# Patient Record
Sex: Female | Born: 1960
Health system: Southern US, Community
[De-identification: ages and names within clinical notes are randomized; demographics above are authoritative.]

## PROBLEM LIST (undated history)

## (undated) DIAGNOSIS — T7840XA Allergy, unspecified, initial encounter: Secondary | ICD-10-CM

## (undated) DIAGNOSIS — D249 Benign neoplasm of unspecified breast: Secondary | ICD-10-CM

## (undated) DIAGNOSIS — K219 Gastro-esophageal reflux disease without esophagitis: Secondary | ICD-10-CM

## (undated) DIAGNOSIS — N390 Urinary tract infection, site not specified: Secondary | ICD-10-CM

## (undated) HISTORY — DX: Allergy, unspecified, initial encounter: T78.40XA

## (undated) HISTORY — PX: FRACTURE SURGERY: SHX138

## (undated) HISTORY — DX: Benign neoplasm of unspecified breast: D24.9

---

## 1998-08-01 ENCOUNTER — Other Ambulatory Visit: Admission: RE | Admit: 1998-08-01 | Discharge: 1998-08-01 | Payer: Self-pay | Admitting: Podiatry

## 2002-08-17 ENCOUNTER — Other Ambulatory Visit: Admission: RE | Admit: 2002-08-17 | Discharge: 2002-08-17 | Payer: Self-pay | Admitting: Obstetrics and Gynecology

## 2003-10-24 ENCOUNTER — Other Ambulatory Visit: Admission: RE | Admit: 2003-10-24 | Discharge: 2003-10-24 | Payer: Self-pay | Admitting: Obstetrics and Gynecology

## 2004-03-14 ENCOUNTER — Encounter: Admission: RE | Admit: 2004-03-14 | Discharge: 2004-03-14 | Payer: Self-pay | Admitting: Obstetrics and Gynecology

## 2004-05-07 ENCOUNTER — Encounter: Admission: RE | Admit: 2004-05-07 | Discharge: 2004-05-07 | Payer: Self-pay | Admitting: Interventional Radiology

## 2004-06-04 ENCOUNTER — Observation Stay (HOSPITAL_COMMUNITY): Admission: RE | Admit: 2004-06-04 | Discharge: 2004-06-05 | Payer: Self-pay | Admitting: Interventional Radiology

## 2004-07-31 ENCOUNTER — Ambulatory Visit (HOSPITAL_COMMUNITY): Admission: RE | Admit: 2004-07-31 | Discharge: 2004-07-31 | Payer: Self-pay | Admitting: Obstetrics and Gynecology

## 2004-12-03 ENCOUNTER — Encounter: Admission: RE | Admit: 2004-12-03 | Discharge: 2004-12-03 | Payer: Self-pay | Admitting: Obstetrics and Gynecology

## 2004-12-24 ENCOUNTER — Encounter: Admission: RE | Admit: 2004-12-24 | Discharge: 2004-12-24 | Payer: Self-pay | Admitting: Interventional Radiology

## 2005-05-07 ENCOUNTER — Emergency Department (HOSPITAL_COMMUNITY): Admission: EM | Admit: 2005-05-07 | Discharge: 2005-05-07 | Payer: Self-pay | Admitting: Emergency Medicine

## 2005-11-27 ENCOUNTER — Ambulatory Visit (HOSPITAL_COMMUNITY): Admission: RE | Admit: 2005-11-27 | Discharge: 2005-11-27 | Payer: Self-pay | Admitting: Obstetrics and Gynecology

## 2006-07-28 HISTORY — PX: ENDOMETRIAL ABLATION: SHX621

## 2007-07-06 ENCOUNTER — Ambulatory Visit (HOSPITAL_COMMUNITY): Admission: RE | Admit: 2007-07-06 | Discharge: 2007-07-06 | Payer: Self-pay | Admitting: Obstetrics and Gynecology

## 2008-11-20 ENCOUNTER — Ambulatory Visit (HOSPITAL_COMMUNITY): Admission: RE | Admit: 2008-11-20 | Discharge: 2008-11-20 | Payer: Self-pay | Admitting: Obstetrics and Gynecology

## 2009-11-22 ENCOUNTER — Ambulatory Visit (HOSPITAL_COMMUNITY): Admission: RE | Admit: 2009-11-22 | Discharge: 2009-11-22 | Payer: Self-pay | Admitting: Obstetrics and Gynecology

## 2010-08-18 ENCOUNTER — Encounter: Payer: Self-pay | Admitting: Obstetrics and Gynecology

## 2010-10-18 ENCOUNTER — Other Ambulatory Visit (HOSPITAL_COMMUNITY): Payer: Self-pay | Admitting: Obstetrics and Gynecology

## 2010-10-18 DIAGNOSIS — Z1231 Encounter for screening mammogram for malignant neoplasm of breast: Secondary | ICD-10-CM

## 2010-11-26 ENCOUNTER — Ambulatory Visit (HOSPITAL_COMMUNITY)
Admission: RE | Admit: 2010-11-26 | Discharge: 2010-11-26 | Disposition: A | Discharge status: Home / Self Care | Payer: BC Managed Care – PPO | Source: Ambulatory Visit | Attending: Obstetrics and Gynecology | Admitting: Obstetrics and Gynecology

## 2010-11-26 DIAGNOSIS — Z1231 Encounter for screening mammogram for malignant neoplasm of breast: Secondary | ICD-10-CM | POA: Insufficient documentation

## 2011-09-30 ENCOUNTER — Other Ambulatory Visit: Payer: Self-pay | Admitting: Obstetrics and Gynecology

## 2011-09-30 DIAGNOSIS — N6321 Unspecified lump in the left breast, upper outer quadrant: Secondary | ICD-10-CM

## 2011-10-07 ENCOUNTER — Ambulatory Visit
Admission: RE | Admit: 2011-10-07 | Discharge: 2011-10-07 | Disposition: A | Discharge status: Home / Self Care | Payer: BC Managed Care – PPO | Source: Ambulatory Visit | Attending: Obstetrics and Gynecology | Admitting: Obstetrics and Gynecology

## 2011-10-07 DIAGNOSIS — N6321 Unspecified lump in the left breast, upper outer quadrant: Secondary | ICD-10-CM

## 2012-05-11 ENCOUNTER — Other Ambulatory Visit: Payer: Self-pay | Admitting: Obstetrics and Gynecology

## 2012-05-11 DIAGNOSIS — Z1231 Encounter for screening mammogram for malignant neoplasm of breast: Secondary | ICD-10-CM

## 2012-06-11 ENCOUNTER — Ambulatory Visit
Admission: RE | Admit: 2012-06-11 | Discharge: 2012-06-11 | Disposition: A | Discharge status: Home / Self Care | Payer: BC Managed Care – PPO | Source: Ambulatory Visit | Attending: Obstetrics and Gynecology | Admitting: Obstetrics and Gynecology

## 2012-06-11 DIAGNOSIS — Z1231 Encounter for screening mammogram for malignant neoplasm of breast: Secondary | ICD-10-CM

## 2012-06-15 ENCOUNTER — Other Ambulatory Visit: Payer: Self-pay | Admitting: Obstetrics and Gynecology

## 2012-06-15 DIAGNOSIS — R928 Other abnormal and inconclusive findings on diagnostic imaging of breast: Secondary | ICD-10-CM

## 2012-06-22 ENCOUNTER — Other Ambulatory Visit: Payer: Self-pay | Admitting: Obstetrics and Gynecology

## 2012-06-22 ENCOUNTER — Ambulatory Visit
Admission: RE | Admit: 2012-06-22 | Discharge: 2012-06-22 | Disposition: A | Discharge status: Home / Self Care | Payer: BC Managed Care – PPO | Source: Ambulatory Visit | Attending: Obstetrics and Gynecology | Admitting: Obstetrics and Gynecology

## 2012-06-22 DIAGNOSIS — R928 Other abnormal and inconclusive findings on diagnostic imaging of breast: Secondary | ICD-10-CM

## 2012-07-06 ENCOUNTER — Ambulatory Visit
Admission: RE | Admit: 2012-07-06 | Discharge: 2012-07-06 | Disposition: A | Discharge status: Home / Self Care | Payer: BC Managed Care – PPO | Source: Ambulatory Visit | Attending: Obstetrics and Gynecology | Admitting: Obstetrics and Gynecology

## 2012-07-06 ENCOUNTER — Other Ambulatory Visit: Payer: Self-pay | Admitting: Diagnostic Radiology

## 2012-07-06 DIAGNOSIS — R928 Other abnormal and inconclusive findings on diagnostic imaging of breast: Secondary | ICD-10-CM

## 2012-07-16 ENCOUNTER — Ambulatory Visit (INDEPENDENT_AMBULATORY_CARE_PROVIDER_SITE_OTHER): Payer: BC Managed Care – PPO | Admitting: Surgery

## 2012-07-16 ENCOUNTER — Encounter (INDEPENDENT_AMBULATORY_CARE_PROVIDER_SITE_OTHER): Payer: Self-pay | Admitting: Surgery

## 2012-07-16 VITALS — BP 132/78 | HR 76 | Temp 97.8°F | Resp 16 | Ht 67.0 in | Wt 155.1 lb

## 2012-07-16 DIAGNOSIS — D249 Benign neoplasm of unspecified breast: Secondary | ICD-10-CM

## 2012-07-16 NOTE — Progress Notes (Signed)
Patient ID: Sheryl Bryan, female   DOB: March 08, 1961, 51 y.o.   MRN: [id number]  Chief Complaint  Patient presents with  . New Evaluation    Breast papilloma    HPI Sheryl Bryan is a 51 y.o. female.  Pt sent at request of Dr Si Gaul for right breast calcifications and core biopsy showing papilloma. The calcifications were indeterminate.  No history of breast mass nipple discharge or pain. HPI  Past Medical History  Diagnosis Date  . Papilloma of breast     Past Surgical History  Procedure Date  . Cesarean section 12/04/93, 01/05/79  . Endometrial ablation 2008    Family History  Problem Relation Age of Onset  . Cancer Mother     multiple myeloma  . Cancer Brother     leukemia  . Cancer Maternal Aunt     breast  . Cancer Maternal Aunt   . COPD Maternal Aunt     breast - great aunt    Social History History  Substance Use Topics  . Smoking status: Never Smoker   . Smokeless tobacco: Never Used  . Alcohol Use: No    Allergies  Allergen Reactions  . Sulfa Antibiotics Hives    Spreads all over the body    Current Outpatient Prescriptions  Medication Sig Dispense Refill  . norethindrone (JOLIVETTE) 0.35 MG tablet Take 1 tablet by mouth daily.      . valACYclovir (VALTREX) 500 MG tablet Take 500 mg by mouth 2 (two) times daily.        Review of Systems Review of Systems  Constitutional: Negative for fever, chills and unexpected weight change.  HENT: Negative for hearing loss, congestion, sore throat, trouble swallowing and voice change.   Eyes: Negative for visual disturbance.  Respiratory: Negative for cough and wheezing.   Cardiovascular: Negative for chest pain, palpitations and leg swelling.  Gastrointestinal: Negative for nausea, vomiting, abdominal pain, diarrhea, constipation, blood in stool, abdominal distention and anal bleeding.  Genitourinary: Negative for hematuria, vaginal bleeding and difficulty urinating.  Musculoskeletal: Negative for  arthralgias.  Skin: Negative for rash and wound.  Neurological: Negative for seizures, syncope and headaches.  Hematological: Negative for adenopathy. Does not bruise/bleed easily.  Psychiatric/Behavioral: Negative for confusion.    Blood pressure 132/78, pulse 76, temperature 97.8 F (36.6 C), temperature source Temporal, resp. rate 16, height 5\' 7"  (1.702 m), weight 155 lb 2 oz (70.364 kg).  Physical Exam Physical Exam  Constitutional: She is oriented to person, place, and time. She appears well-developed and well-nourished.  HENT:  Head: Normocephalic and atraumatic.  Eyes: EOM are normal. Pupils are equal, round, and reactive to light.  Neck: Normal range of motion. Neck supple.  Cardiovascular: Normal rate and regular rhythm.   Pulmonary/Chest: Effort normal and breath sounds normal. Right breast exhibits no inverted nipple, no mass, no nipple discharge, no skin change and no tenderness. Left breast exhibits no inverted nipple, no mass, no nipple discharge, no skin change and no tenderness. Breasts are symmetrical.  Musculoskeletal: Normal range of motion.  Neurological: She is alert and oriented to person, place, and time.  Skin: Skin is warm and dry.  Psychiatric: She has a normal mood and affect. Her behavior is normal. Judgment and thought content normal.    Data Reviewed Clinical Data: 51 year old female with indeterminate  calcifications in the inner subareolar right breast - for tissue  sampling.  STEREOTACTIC-GUIDED VACUUM ASSISTED BIOPSY OF THE RIGHT BREAST AND  SPECIMEN RADIOGRAPH  Comparison: Previous  exams.  I met with the patient and we discussed the procedure of  stereotactic-guided biopsy, including benefits and alternatives.  We discussed the high likelihood of a successful procedure. We  discussed the risks of the procedure, including infection,  bleeding, tissue injury, clip migration, and inadequate sampling.  Informed, written consent was given.  Using  sterile technique, 2% lidocaine, stereotactic guidance, and a  9 gauge vacuum assisted device, biopsy was performed of the cluster  of calcifications within the inner subareolar right breast using a  inferior approach. Specimen radiograph was performed, showing  calcifications. Specimens with calcifications are identified for  pathology.  At the conclusion of the procedure, a hourglass shaped tissue  marker clip was deployed into the biopsy cavity. Follow-up 2-view  mammogram confirmed clip placement to be satisfactory.  IMPRESSION:  Stereotactic-guided biopsy of right breast calcifications. No  apparent complications.  Final pathology demonstrates PAPILLOMA WITH CALCIFICATIONS - NO  ATYPIA OR MALIGNANCY.  Histology correlates with imaging findings.  The patient was contacted by phone on 07/08/2012 and these results  given to her which she understood. Her questions were answered.  The patient had no complaints with her biopsy site.  Recommend surgical consultation of the right breast. A surgical  consultation appointment Dr. Luisa Hart [facility]) has  been scheduled for 07/16/2012 and the patient informed.  Original Report Authenticated By: Harmon Pier, M.D.   Assessment    Right breast microcalcifications and papilloma on core biopsy    Plan    Recommend right breast needle localized partial mastectomy. Discussed observation as well. Small but finite risk of malignancy in this setting noted. She would like time to think about her choices. I gave her information about lumpectomy. Risks, benefits of each discussed at great length. She will call me once she makes her mind up about next step.       Nicey Krah A. 07/16/2012, 11:11 AM

## 2012-07-16 NOTE — Patient Instructions (Signed)
Lumpectomy, Breast Conserving Surgery Care After Please read the instructions outlined below and refer to this sheet in the next few weeks. These discharge instructions provide you with general information on caring for yourself after you leave the hospital. Your surgeon may also give you specific instructions. While your treatment has been planned according to the most current medical practices available, unavoidable complications occasionally occur. If you have any problems or questions after discharge, please call your surgeon. Reasons for a lumpectomy:  Any solid breast mass.  Grouped significant nodularity that may be confused with a solitary breast mass. AFTER THE PROCEDURE  After surgery, you will be taken to the recovery area where a nurse will watch and check your progress. Once you're awake, stable, and taking fluids well, barring other problems you will be allowed to go home.  Ice packs applied to your operative site may help with discomfort and keep the swelling down.  A small rubber drain may be placed in the incision for a couple of days to prevent a hematoma in the breast.  A pressure dressing may be applied for 24 to 48 hours to prevent bleeding.  Keep the wound dry.  You may resume a normal diet and activities as directed. Avoid strenuous activities affecting the arm on the side of the biopsy site such as tennis, swimming, heavy lifting (more than 10 pounds) or pulling.  Bruising in the breast is normal following this procedure.  Wearing a bra - even to bed - may be more comfortable and also help keep the dressing on.  Change dressings as directed.  Only take over-the-counter or prescription medicines for pain, discomfort, or fever as directed by your caregiver. Call for your results as instructed by your surgeon. Remember it isyour responsibility to get the results of your lumpectomy if your surgeon asked you to follow-up. Do not assume everything is fine if you have  not heard from your caregiver. SEEK MEDICAL CARE IF:   There is increased bleeding (more than a small spot) from the wound.  You notice redness, swelling, or increasing pain in the wound.  Pus is coming from wound.  An unexplained oral temperature above 102 F (38.9 C) develops.  You notice a foul smell coming from the wound or dressing. SEEK IMMEDIATE MEDICAL CARE IF:   You develop a rash.  You have difficulty breathing.  You have any allergic problems. Document Released: 07/30/2006 Document Revised: 10/06/2011 Document Reviewed: 07/02/2007 [facility] Patient Information 2013 [place], [place]. Lumpectomy, Breast Conserving Surgery A lumpectomy is breast surgery that removes only part of the breast. Another name used may be partial mastectomy. The amount removed varies. Make sure you understand how much of your breast will be removed. Reasons for a lumpectomy:  Any solid breast mass.  Grouped significant nodularity that may be confused with a solitary breast mass. Lumpectomy is the most common form of breast cancer surgery today. The surgeon removes the portion of your breast which contains the tumor (cancer). This is the lump. Some normal tissue around the lump is also removed to be sure that all the tumor has been removed.  If cancer cells are found in the margins where the breast tissue was removed, your surgeon will do more surgery to remove the remaining cancer tissue. This is called re-excision surgery. Radiation and/or chemotherapy treatments are often given following a lumpectomy to kill any cancer cells that could possibly remain.  REASONS YOU MAY NOT BE ABLE TO HAVE BREAST CONSERVING SURGERY:  The tumor  is located in more than one place.  Your breast is small and the tumor is large so the breast would be disfigured.  The entire tumor removal is not successful with a lumpectomy.  You cannot commit to a full course of chemotherapy, radiation therapy or are pregnant and  cannot have radiation.  You have previously had radiation to the breast to treat cancer. HOW A LUMPECTOMY IS PERFORMED If overnight nursing is not required following a biopsy, a lumpectomy can be performed as a same-day surgery. This can be done in a hospital, clinic, or surgical center. The anesthesia used will depend on your surgeon. They will discuss this with you. A general anesthetic keeps you sleeping through the procedure. LET YOUR CAREGIVERS KNOW ABOUT THE FOLLOWING:  Allergies  Medications taken including herbs, eye drops, over the counter medications, and creams.  Use of steroids (by mouth or creams)  Previous problems with anesthetics or Novocaine.  Possibility of pregnancy, if this applies  History of blood clots (thrombophlebitis)  History of bleeding or blood problems.  Previous surgery  Other health problems BEFORE THE PROCEDURE You should be present one hour prior to your procedure unless directed otherwise.  AFTER THE PROCEDURE  After surgery, you will be taken to the recovery area where a nurse will watch and check your progress. Once you're awake, stable, and taking fluids well, barring other problems you will be allowed to go home.  Ice packs applied to your operative site may help with discomfort and keep the swelling down.  A small rubber drain may be placed in the breast for a couple of days to prevent a hematoma from developing in the breast.  A pressure dressing may be applied for 24 to 48 hours to prevent bleeding.  Keep the wound dry.  You may resume a normal diet and activities as directed. Avoid strenuous activities affecting the arm on the side of the biopsy site such as tennis, swimming, heavy lifting (more than 10 pounds) or pulling.  Bruising in the breast is normal following this procedure.  Wearing a bra - even to bed - may be more comfortable and also help keep the dressing on.  Change dressings as directed.  Only take over-the-counter  or prescription medicines for pain, discomfort, or fever as directed by your caregiver. Call for your results as instructed by your surgeon. Remember it is your responsibility to get the results of your lumpectomy if your surgeon asked you to follow-up. Do not assume everything is fine if you have not heard from your caregiver. SEEK MEDICAL CARE IF:   There is increased bleeding (more than a small spot) from the wound.  You notice redness, swelling, or increasing pain in the wound.  Pus is coming from wound.  An unexplained oral temperature above 102 F (38.9 C) develops.  You notice a foul smell coming from the wound or dressing. SEEK IMMEDIATE MEDICAL CARE IF:   You develop a rash.  You have difficulty breathing.  You have any allergic problems. Document Released: 08/25/2006 Document Revised: 10/06/2011 Document Reviewed: 11/26/2006 [facility] Patient Information 2013 [place], [place].

## 2012-08-04 ENCOUNTER — Other Ambulatory Visit (INDEPENDENT_AMBULATORY_CARE_PROVIDER_SITE_OTHER): Payer: Self-pay | Admitting: Surgery

## 2012-08-04 DIAGNOSIS — D249 Benign neoplasm of unspecified breast: Secondary | ICD-10-CM

## 2012-08-06 ENCOUNTER — Other Ambulatory Visit (INDEPENDENT_AMBULATORY_CARE_PROVIDER_SITE_OTHER): Payer: Self-pay | Admitting: Surgery

## 2012-08-06 DIAGNOSIS — D249 Benign neoplasm of unspecified breast: Secondary | ICD-10-CM

## 2012-08-27 ENCOUNTER — Encounter (HOSPITAL_COMMUNITY)
Admission: RE | Admit: 2012-08-27 | Discharge: 2012-08-27 | Disposition: A | Discharge status: Home / Self Care | Payer: BC Managed Care – PPO | Source: Ambulatory Visit | Attending: Surgery | Admitting: Surgery

## 2012-08-27 ENCOUNTER — Encounter (HOSPITAL_COMMUNITY): Payer: Self-pay

## 2012-08-27 ENCOUNTER — Encounter (HOSPITAL_COMMUNITY): Payer: Self-pay | Admitting: Respiratory Therapy

## 2012-08-27 VITALS — BP 130/83 | HR 85 | Temp 98.9°F | Resp 20 | Ht 67.0 in | Wt 156.1 lb

## 2012-08-27 DIAGNOSIS — D249 Benign neoplasm of unspecified breast: Secondary | ICD-10-CM

## 2012-08-27 HISTORY — DX: Gastro-esophageal reflux disease without esophagitis: K21.9

## 2012-08-27 HISTORY — DX: Urinary tract infection, site not specified: N39.0

## 2012-08-27 LAB — CBC WITH DIFFERENTIAL/PLATELET
Basophils Absolute: 0 10*3/uL (ref 0.0–0.1)
Basophils Relative: 1 % (ref 0–1)
Eosinophils Absolute: 0.1 10*3/uL (ref 0.0–0.7)
Eosinophils Relative: 3 % (ref 0–5)
HCT: 40.6 % (ref 36.0–46.0)
Hemoglobin: 13.5 g/dL (ref 12.0–15.0)
Lymphocytes Relative: 44 % (ref 12–46)
Lymphs Abs: 2 10*3/uL (ref 0.7–4.0)
MCH: 27 pg (ref 26.0–34.0)
MCHC: 33.3 g/dL (ref 30.0–36.0)
MCV: 81.2 fL (ref 78.0–100.0)
Monocytes Absolute: 0.4 10*3/uL (ref 0.1–1.0)
Monocytes Relative: 9 % (ref 3–12)
Neutro Abs: 2 10*3/uL (ref 1.7–7.7)
Neutrophils Relative %: 44 % (ref 43–77)
Platelets: 234 10*3/uL (ref 150–400)
RBC: 5 MIL/uL (ref 3.87–5.11)
RDW: 14.1 % (ref 11.5–15.5)
WBC: 4.5 10*3/uL (ref 4.0–10.5)

## 2012-08-27 LAB — HCG, SERUM, QUALITATIVE: Preg, Serum: NEGATIVE

## 2012-08-27 LAB — COMPREHENSIVE METABOLIC PANEL
ALT: 10 U/L (ref 0–35)
AST: 18 U/L (ref 0–37)
Albumin: 4 g/dL (ref 3.5–5.2)
Alkaline Phosphatase: 46 U/L (ref 39–117)
BUN: 10 mg/dL (ref 6–23)
CO2: 24 mEq/L (ref 19–32)
Calcium: 9.3 mg/dL (ref 8.4–10.5)
Chloride: 105 mEq/L (ref 96–112)
Creatinine, Ser: 0.77 mg/dL (ref 0.50–1.10)
GFR calc Af Amer: >90 mL/min (ref >90)
GFR calc non Af Amer: >90 mL/min (ref >90)
Glucose, Bld: 85 mg/dL (ref 70–99)
Potassium: 4.3 mEq/L (ref 3.5–5.1)
Sodium: 138 mEq/L (ref 135–145)
Total Bilirubin: 0.6 mg/dL (ref 0.3–1.2)
Total Protein: 6.8 g/dL (ref 6.0–8.3)

## 2012-08-27 LAB — SURGICAL PCR SCREEN
MRSA, PCR: NEGATIVE
Staphylococcus aureus: NEGATIVE

## 2012-08-27 NOTE — Pre-Procedure Instructions (Addendum)
Sheryl Bryan  08/27/2012   Your procedure is scheduled on:09/01/12  Report to [facility] at when through at breast center AM.  Call this number if you have problems the morning of surgery: [phone]   Remember:   Do not eat food or drink liquids after midnight.   Take these medicines the morning of surgery with A SIP OF WATER:birth control, valtrex if needed STOP any herbal meds, blood thinners, aspirin now   Do not wear jewelry, make-up or nail polish.  Do not wear lotions, powders, or perfumes. You may wear deodorant.  Do not shave 48 hours prior to surgery. Men may shave face and neck.  Do not bring valuables to the hospital.  Contacts, dentures or bridgework may not be worn into surgery.  Leave suitcase in the car. After surgery it may be brought to your room.  For patients admitted to the hospital, checkout time is 11:00 AM the day of  discharge.   Patients discharged the day of surgery will not be allowed to drive  home.  Name and phone number of your driver: Fayrene Fearing  [phone]  Special Instructions: Shower using CHG 2 nights before surgery and the night before surgery.  If you shower the day of surgery use CHG.  Use special wash - you have one bottle of CHG for all showers.  You should use approximately 1/3 of the bottle for each shower.   Please read over the following fact sheets that you were given: Pain Booklet, Coughing and Deep Breathing, MRSA Information and Surgical Site Infection Prevention

## 2012-08-31 MED ORDER — DEXTROSE 5 % IV SOLN
3.0000 g | INTRAVENOUS | Status: AC
  Administered 2012-09-01: 3 g via INTRAVENOUS
  Filled 2012-08-31: qty 3000

## 2012-09-01 ENCOUNTER — Encounter (HOSPITAL_COMMUNITY)
Admission: RE | Disposition: A | Discharge status: Home / Self Care | Payer: Self-pay | Source: Ambulatory Visit | Attending: Surgery

## 2012-09-01 ENCOUNTER — Ambulatory Visit (HOSPITAL_COMMUNITY)
Admission: RE | Admit: 2012-09-01 | Discharge: 2012-09-01 | Disposition: A | Discharge status: Home / Self Care | Payer: BC Managed Care – PPO | Source: Ambulatory Visit | Attending: Surgery | Admitting: Surgery

## 2012-09-01 ENCOUNTER — Encounter (HOSPITAL_COMMUNITY): Payer: Self-pay | Admitting: *Deleted

## 2012-09-01 ENCOUNTER — Encounter (HOSPITAL_COMMUNITY): Payer: Self-pay | Admitting: Certified Registered Nurse Anesthetist

## 2012-09-01 ENCOUNTER — Ambulatory Visit (HOSPITAL_COMMUNITY): Payer: BC Managed Care – PPO | Admitting: Certified Registered Nurse Anesthetist

## 2012-09-01 ENCOUNTER — Ambulatory Visit
Admission: RE | Admit: 2012-09-01 | Discharge: 2012-09-01 | Disposition: A | Discharge status: Home / Self Care | Payer: BC Managed Care – PPO | Source: Ambulatory Visit | Attending: Surgery | Admitting: Surgery

## 2012-09-01 DIAGNOSIS — Z01812 Encounter for preprocedural laboratory examination: Secondary | ICD-10-CM | POA: Insufficient documentation

## 2012-09-01 DIAGNOSIS — D249 Benign neoplasm of unspecified breast: Secondary | ICD-10-CM | POA: Insufficient documentation

## 2012-09-01 HISTORY — PX: PARTIAL MASTECTOMY WITH NEEDLE LOCALIZATION: SHX6008

## 2012-09-01 SURGERY — PARTIAL MASTECTOMY WITH NEEDLE LOCALIZATION
Anesthesia: General | Site: Breast | Laterality: Right | Wound class: Clean

## 2012-09-01 MED ORDER — HYDROCODONE-ACETAMINOPHEN 5-325 MG PO TABS
1.0000 | ORAL_TABLET | Freq: Once | ORAL | Status: AC
  Administered 2012-09-01: 1 via ORAL

## 2012-09-01 MED ORDER — PROPOFOL 10 MG/ML IV BOLUS
INTRAVENOUS | Status: DC | PRN
  Administered 2012-09-01: 150 mg via INTRAVENOUS

## 2012-09-01 MED ORDER — DEXAMETHASONE SODIUM PHOSPHATE 4 MG/ML IJ SOLN
INTRAMUSCULAR | Status: DC | PRN
  Administered 2012-09-01: 4 mg via INTRAVENOUS

## 2012-09-01 MED ORDER — 0.9 % SODIUM CHLORIDE (POUR BTL) OPTIME
TOPICAL | Status: DC | PRN
  Administered 2012-09-01: 1000 mL

## 2012-09-01 MED ORDER — MIDAZOLAM HCL 5 MG/5ML IJ SOLN
INTRAMUSCULAR | Status: DC | PRN
  Administered 2012-09-01: 2 mg via INTRAVENOUS

## 2012-09-01 MED ORDER — BUPIVACAINE-EPINEPHRINE 0.25% -1:200000 IJ SOLN
INTRAMUSCULAR | Status: DC | PRN
  Administered 2012-09-01: 7 mL

## 2012-09-01 MED ORDER — PROMETHAZINE HCL 25 MG/ML IJ SOLN: 6.2500 mg | INTRAMUSCULAR | Status: DC | PRN

## 2012-09-01 MED ORDER — DEXTROSE 5 % IV SOLN
3.0000 g | INTRAVENOUS | Status: DC
  Filled 2012-09-01: qty 3000

## 2012-09-01 MED ORDER — BUPIVACAINE-EPINEPHRINE PF 0.25-1:200000 % IJ SOLN
INTRAMUSCULAR | Status: AC
  Filled 2012-09-01: qty 30

## 2012-09-01 MED ORDER — LACTATED RINGERS IV SOLN
INTRAVENOUS | Status: DC | PRN
  Administered 2012-09-01: 15:00:00 via INTRAVENOUS

## 2012-09-01 MED ORDER — HYDROCODONE-ACETAMINOPHEN 5-325 MG PO TABS
ORAL_TABLET | ORAL | Status: AC
  Filled 2012-09-01: qty 1

## 2012-09-01 MED ORDER — FENTANYL CITRATE 0.05 MG/ML IJ SOLN
INTRAMUSCULAR | Status: DC | PRN
  Administered 2012-09-01: 25 ug via INTRAVENOUS
  Administered 2012-09-01: 50 ug via INTRAVENOUS

## 2012-09-01 MED ORDER — HYDROMORPHONE HCL PF 1 MG/ML IJ SOLN
0.2500 mg | INTRAMUSCULAR | Status: DC | PRN
  Administered 2012-09-01: 0.25 mg via INTRAVENOUS

## 2012-09-01 MED ORDER — CHLORHEXIDINE GLUCONATE 4 % EX LIQD: 1.0000 "application " | Freq: Once | CUTANEOUS | Status: DC

## 2012-09-01 MED ORDER — HYDROCODONE-ACETAMINOPHEN 5-325 MG PO TABS: 1.0000 | ORAL_TABLET | Freq: Four times a day (QID) | ORAL | Status: DC | PRN

## 2012-09-01 MED ORDER — HYDROMORPHONE HCL PF 1 MG/ML IJ SOLN
INTRAMUSCULAR | Status: AC
  Administered 2012-09-01: 0.25 mg via INTRAVENOUS
  Filled 2012-09-01: qty 1

## 2012-09-01 MED ORDER — LIDOCAINE HCL (CARDIAC) 20 MG/ML IV SOLN
INTRAVENOUS | Status: DC | PRN
  Administered 2012-09-01: 80 mg via INTRAVENOUS

## 2012-09-01 MED ORDER — ONDANSETRON HCL 4 MG/2ML IJ SOLN
INTRAMUSCULAR | Status: DC | PRN
  Administered 2012-09-01: 4 mg via INTRAVENOUS

## 2012-09-01 MED ORDER — LACTATED RINGERS IV SOLN
INTRAVENOUS | Status: DC
  Administered 2012-09-01: 15:00:00 via INTRAVENOUS

## 2012-09-01 SURGICAL SUPPLY — 48 items
ADH SKN CLS APL DERMABOND .7 (GAUZE/BANDAGES/DRESSINGS) ×1
ADH SKN CLS LQ APL DERMABOND (GAUZE/BANDAGES/DRESSINGS) ×1
APPLIER CLIP 9.375 MED OPEN (MISCELLANEOUS)
APR CLP MED 9.3 20 MLT OPN (MISCELLANEOUS)
BINDER BREAST LRG (GAUZE/BANDAGES/DRESSINGS) IMPLANT
BINDER BREAST XLRG (GAUZE/BANDAGES/DRESSINGS) IMPLANT
BLADE SURG 10 STRL SS (BLADE) ×2 IMPLANT
BLADE SURG 15 STRL LF DISP TIS (BLADE) ×1 IMPLANT
BLADE SURG 15 STRL SS (BLADE) ×2
CANISTER SUCTION 2500CC (MISCELLANEOUS) IMPLANT
CHLORAPREP W/TINT 26ML (MISCELLANEOUS) ×2 IMPLANT
CLIP APPLIE 9.375 MED OPEN (MISCELLANEOUS) IMPLANT
CLOTH BEACON ORANGE TIMEOUT ST (SAFETY) ×2 IMPLANT
COVER SURGICAL LIGHT HANDLE (MISCELLANEOUS) ×2 IMPLANT
DERMABOND ADHESIVE PROPEN (GAUZE/BANDAGES/DRESSINGS) ×1
DERMABOND ADVANCED (GAUZE/BANDAGES/DRESSINGS) ×1
DERMABOND ADVANCED .7 DNX12 (GAUZE/BANDAGES/DRESSINGS) ×1 IMPLANT
DERMABOND ADVANCED .7 DNX6 (GAUZE/BANDAGES/DRESSINGS) ×1 IMPLANT
DEVICE DUBIN SPECIMEN MAMMOGRA (MISCELLANEOUS) ×2 IMPLANT
DRAPE CHEST BREAST 15X10 FENES (DRAPES) ×2 IMPLANT
ELECT CAUTERY BLADE 6.4 (BLADE) ×2 IMPLANT
ELECT REM PT RETURN 9FT ADLT (ELECTROSURGICAL) ×2
ELECTRODE REM PT RTRN 9FT ADLT (ELECTROSURGICAL) ×1 IMPLANT
GLOVE BIO SURGEON STRL SZ7 (GLOVE) ×2 IMPLANT
GLOVE BIOGEL PI IND STRL 7.5 (GLOVE) ×1 IMPLANT
GLOVE BIOGEL PI INDICATOR 7.5 (GLOVE) ×1
GOWN STRL NON-REIN LRG LVL3 (GOWN DISPOSABLE) ×4 IMPLANT
KIT BASIN OR (CUSTOM PROCEDURE TRAY) ×2 IMPLANT
KIT MARKER MARGIN INK (KITS) IMPLANT
KIT ROOM TURNOVER OR (KITS) ×2 IMPLANT
NS IRRIG 1000ML POUR BTL (IV SOLUTION) ×2 IMPLANT
PACK SURGICAL SETUP 50X90 (CUSTOM PROCEDURE TRAY) ×2 IMPLANT
PAD ARMBOARD 7.5X6 YLW CONV (MISCELLANEOUS) ×2 IMPLANT
PENCIL BUTTON HOLSTER BLD 10FT (ELECTRODE) ×2 IMPLANT
SPONGE LAP 18X18 X RAY DECT (DISPOSABLE) ×2 IMPLANT
STAPLER VISISTAT 35W (STAPLE) ×2 IMPLANT
SUT MNCRL AB 4-0 PS2 18 (SUTURE) IMPLANT
SUT SILK 2 0 SH (SUTURE) IMPLANT
SUT VIC AB 2-0 SH 27 (SUTURE) ×2
SUT VIC AB 2-0 SH 27XBRD (SUTURE) ×1 IMPLANT
SUT VIC AB 3-0 SH 27 (SUTURE) ×2
SUT VIC AB 3-0 SH 27X BRD (SUTURE) ×1 IMPLANT
SYR BULB 3OZ (MISCELLANEOUS) ×2 IMPLANT
SYR CONTROL 10ML LL (SYRINGE) ×2 IMPLANT
TOWEL OR 17X24 6PK STRL BLUE (TOWEL DISPOSABLE) ×2 IMPLANT
TOWEL OR 17X26 10 PK STRL BLUE (TOWEL DISPOSABLE) ×2 IMPLANT
TUBE CONNECTING 12X1/4 (SUCTIONS) IMPLANT
YANKAUER SUCT BULB TIP NO VENT (SUCTIONS) IMPLANT

## 2012-09-01 NOTE — Anesthesia Postprocedure Evaluation (Signed)
Anesthesia Post Note  Patient: Sheryl Bryan  Procedure(s) Performed: Procedure(s) (LRB): PARTIAL MASTECTOMY WITH NEEDLE LOCALIZATION (Right)  Anesthesia type: general  Patient location: PACU  Post pain: Pain level controlled  Post assessment: Patient's Cardiovascular Status Stable  Last Vitals:  Filed Vitals:   09/01/12 1600  BP: 134/79  Pulse: 84  Temp:   Resp: 13    Post vital signs: Reviewed and stable  Level of consciousness: sedated  Complications: No apparent anesthesia complications

## 2012-09-01 NOTE — Anesthesia Preprocedure Evaluation (Signed)
Anesthesia Evaluation  Patient identified by MRN, date of birth, ID band Patient awake    Reviewed: Allergy & Precautions, H&P , NPO status , Patient's Chart, lab work & pertinent test results  Airway Mallampati: I TM Distance: >3 FB Neck ROM: Full    Dental   Pulmonary  breath sounds clear to auscultation        Cardiovascular Rhythm:Regular Rate:Normal     Neuro/Psych    GI/Hepatic GERD-  Controlled and Medicated,  Endo/Other    Renal/GU      Musculoskeletal   Abdominal   Peds  Hematology   Anesthesia Other Findings   Reproductive/Obstetrics                           Anesthesia Physical Anesthesia Plan  ASA: I  Anesthesia Plan: General   Post-op Pain Management:    Induction: Intravenous  Airway Management Planned: LMA  Additional Equipment:   Intra-op Plan:   Post-operative Plan: Extubation in OR  Informed Consent: I have reviewed the patients History and Physical, chart, labs and discussed the procedure including the risks, benefits and alternatives for the proposed anesthesia with the patient or authorized representative who has indicated his/her understanding and acceptance.     Plan Discussed with: CRNA and Surgeon  Anesthesia Plan Comments:         Anesthesia Quick Evaluation

## 2012-09-01 NOTE — H&P (Signed)
Demographics Sheryl Bryan 52 year old female  Comm Pref: None [address]  [place] [address] [phone] (M)  Works as Presenter, broadcasting at [employer]  Problem ListNone  Significant History/Details  Smoking: Never Smoker   Smokeless Tobacco: Never Used  Alcohol: No  2 open orders  Language: English   Specialty CommentsEditShow AllReportDOS 09/01/12 [facility](rm 8)-OP-Rt breast EX(5284 BCG) partical mastectomy de 08/10/12  08/11/2012 patient scheduled for op surgery 09/01/2012 @ [facility] no precert required. (de,chm)    MedicationsLong-TermPrescriptions Show Facility-Administered Medications    ibuprofen (ADVIL,MOTRIN) 200 MG tablet   norethindrone (JOLIVETTE) 0.35 MG tablet   omeprazole (PRILOSEC OTC) 20 MG tablet    valACYclovir (VALTREX) 500 MG tablet     Relevant Labs (3 years)  Na K Cl C02 WBC Hgb Hct Plts  08/27/12 0949 -- -- -- -- 4.5 13.5 40.6 234  08/27/12 0949 138 4.3 105 -- -- -- -- --                  Relevant Encounters (Maximum of 10 visits)Date Type Department Provider Description  09/01/2012 Surgery [facility] OPERATING ROOM Dortha Schwalbe., MD   08/06/2012 Orders Only [facility] Jerilee Space A., MD Papilloma of Breast (Primary Dx)  08/04/2012 Orders Only [facility] Harriette Bouillon A., MD Papilloma of Breast (Primary Dx)  07/16/2012 Office Visit [facility] Koltin Wehmeyer A., MD Papilloma of Breast (Primary Dx)          My Last Outpatient Progress NoteStatus Last Edited Encounter Date  Patient ID: Sheryl Bryan, female   DOB: Oct 17, 1960, 52 y.o.   MRN: [id number]    Chief Complaint   Patient presents with   .  New Evaluation       Breast papilloma      HPI Sheryl Bryan is a 52 y.o. female.  Pt sent at request of Dr Si Gaul for right breast calcifications and core biopsy showing papilloma. The calcifications were indeterminate.   No history of breast mass nipple discharge or pain. HPI    Past Medical History   Diagnosis  Date   .  Papilloma of breast         Past Surgical History   Procedure  Date   .  Cesarean section  12/04/93, 01/05/79   .  Endometrial ablation  2008       Family History   Problem  Relation  Age of Onset   .  Cancer  Mother         multiple myeloma   .  Cancer  Brother         leukemia   .  Cancer  Maternal Aunt         breast   .  Cancer  Maternal Aunt     .  COPD  Maternal Aunt         breast - great aunt      Social History History   Substance Use Topics   .  Smoking status:  Never Smoker    .  Smokeless tobacco:  Never Used   .  Alcohol Use:  No       Allergies   Allergen  Reactions   .  Sulfa Antibiotics  Hives       Spreads all over the body       Current Outpatient Prescriptions   Medication  Sig  Dispense  Refill   .  norethindrone (JOLIVETTE) 0.35 MG tablet  Take 1 tablet by mouth daily.         .  valACYclovir (VALTREX) 500 MG tablet  Take 500 mg by mouth 2 (two) times daily.            Review of Systems Review of Systems  Constitutional: Negative for fever, chills and unexpected weight change.  HENT: Negative for hearing loss, congestion, sore throat, trouble swallowing and voice change.   Eyes: Negative for visual disturbance.  Respiratory: Negative for cough and wheezing.   Cardiovascular: Negative for chest pain, palpitations and leg swelling.  Gastrointestinal: Negative for nausea, vomiting, abdominal pain, diarrhea, constipation, blood in stool, abdominal distention and anal bleeding.  Genitourinary: Negative for hematuria, vaginal bleeding and difficulty urinating.  Musculoskeletal: Negative for arthralgias.  Skin: Negative for rash and wound.  Neurological: Negative for seizures, syncope and headaches.  Hematological: Negative for adenopathy. Does not bruise/bleed easily.  Psychiatric/Behavioral: Negative for confusion.    Blood pressure  132/78, pulse 76, temperature 97.8 F (36.6 C), temperature source Temporal, resp. rate 16, height 5\' 7"  (1.702 m), weight 155 lb 2 oz (70.364 kg).   Physical Exam Physical Exam  Constitutional: She is oriented to person, place, and time. She appears well-developed and well-nourished.  HENT:   Head: Normocephalic and atraumatic.  Eyes: EOM are normal. Pupils are equal, round, and reactive to light.  Neck: Normal range of motion. Neck supple.  Cardiovascular: Normal rate and regular rhythm.   Pulmonary/Chest: Effort normal and breath sounds normal. Right breast exhibits no inverted nipple, no mass, no nipple discharge, no skin change and no tenderness. Left breast exhibits no inverted nipple, no mass, no nipple discharge, no skin change and no tenderness. Breasts are symmetrical.  Musculoskeletal: Normal range of motion.  Neurological: She is alert and oriented to person, place, and time.  Skin: Skin is warm and dry.  Psychiatric: She has a normal mood and affect. Her behavior is normal. Judgment and thought content normal.    Data Reviewed Clinical Data: 52 year old female with indeterminate   calcifications in the inner subareolar right breast - for tissue   sampling.   STEREOTACTIC-GUIDED VACUUM ASSISTED BIOPSY OF THE RIGHT BREAST AND   SPECIMEN RADIOGRAPH   Comparison: Previous exams.   I met with the patient and we discussed the procedure of   stereotactic-guided biopsy, including benefits and alternatives.   We discussed the high likelihood of a successful procedure. We   discussed the risks of the procedure, including infection,   bleeding, tissue injury, clip migration, and inadequate sampling.   Informed, written consent was given.   Using sterile technique, 2% lidocaine, stereotactic guidance, and a   9 gauge vacuum assisted device, biopsy was performed of the cluster   of calcifications within the inner subareolar right breast using a   inferior approach. Specimen  radiograph was performed, showing   calcifications. Specimens with calcifications are identified for   pathology.   At the conclusion of the procedure, a hourglass shaped tissue   marker clip was deployed into the biopsy cavity. Follow-up 2-view   mammogram confirmed clip placement to be satisfactory.   IMPRESSION:   Stereotactic-guided biopsy of right breast calcifications. No   apparent complications.   Final pathology demonstrates PAPILLOMA WITH CALCIFICATIONS - NO   ATYPIA OR MALIGNANCY.   Histology correlates with imaging findings.   The patient was contacted by phone on 07/08/2012 and these results   given to her which she understood. Her  questions were answered.   The patient had no complaints with her biopsy site.   Recommend surgical consultation of the right breast. A surgical   consultation appointment Dr. Luisa Hart [facility]) has   been scheduled for 07/16/2012 and the patient informed.   Original Report Authenticated By: Harmon Pier, M.D.     Assessment Right breast microcalcifications and papilloma on core biopsy   Plan Recommend right breast needle localized partial mastectomy. Discussed observation as well. Small but finite risk of malignancy in this setting noted. She would like time to think about her choices. I gave her information about lumpectomy. Risks, benefits of each discussed at great length. She will call me once she makes her mind up about next step.       Bea Duren A.

## 2012-09-01 NOTE — Transfer of Care (Signed)
Immediate Anesthesia Transfer of Care Note  Patient: Sheryl Bryan  Procedure(s) Performed: Procedure(s) (LRB) with comments: PARTIAL MASTECTOMY WITH NEEDLE LOCALIZATION (Right)  Patient Location: PACU  Anesthesia Type:General  Level of Consciousness: awake, alert  and oriented  Airway & Oxygen Therapy: Patient Spontanous Breathing and Patient connected to nasal cannula oxygen  Post-op Assessment: Report given to PACU RN and Post -op Vital signs reviewed and stable  Post vital signs: Reviewed and stable  Complications: No apparent anesthesia complications

## 2012-09-01 NOTE — Preoperative (Signed)
Beta Blockers   Reason not to administer Beta Blockers:Not Applicable 

## 2012-09-01 NOTE — Interval H&P Note (Signed)
History and Physical Interval Note:  09/01/2012 2:04 PM  Sheryl Bryan  has presented today for surgery, with the diagnosis of right breast papilloma  The various methods of treatment have been discussed with the patient and family. After consideration of risks, benefits and other options for treatment, the patient has consented to  Procedure(s) (LRB) with comments: PARTIAL MASTECTOMY WITH NEEDLE LOCALIZATION (Right) as a surgical intervention .  The patient's history has been reviewed, patient examined, no change in status, stable for surgery.  I have reviewed the patient's chart and labs.  Questions were answered to the patient's satisfaction.     Darrielle Pflieger A.

## 2012-09-01 NOTE — Op Note (Signed)
Preoperative diagnosis: right breast mass / papilloma  Postop diagnosis: Same  Procedure: right  Partial mastectomy with wire localization  Surgeon: Harriette Bouillon M.D.  Anesthesia: LMA with 0.25% Sensorcaine local with epi   EBL: Less than 40 cc  Specimen: right  Breast mass with wire and clip verified by radiography to pathology  Drains: None  Indications for procedure: The patient presents with a breast mass. Core biopsy showed it to be consistent with papilloma. Options of observation or resection discussed.  Small but possilble malignant risk discussed.   The patient wants to proceed with partial mastectomy with wire localization.  Description of procedure: The patient was seen in the holding area and the appropriate side was marked. Questions are answered. Wire localization was done the radiology. The patient was taken back to the operating room and placed supine on the operating room table. Wire trimmed.  After induction of general anesthesia,  Right  chest and upper arm  were prepped and draped in a sterile fashion. Timeout was done and she received preoperative antibiotics. Curvilinear incision was made around the wire insertion site at the medial border of the niple. All tissue around the wire was excised and hemostasis was achieved with cautery. Multiple small dilated ducts encountered.  The area was removed in its entirety upon gross examination. Gross margin negative. Radiograph revealed the mass, wire and clip to be in the specimen. The wound was closed in layers using 3-0 Vicryl and 4-0 Monocryl subcuticular stitch. Dermabond applied. All final counts found to be correct. Patient awoke extubated taken recovery in satisfactory condition.

## 2012-09-03 ENCOUNTER — Encounter (HOSPITAL_COMMUNITY): Payer: Self-pay | Admitting: Surgery

## 2012-09-06 ENCOUNTER — Telehealth (INDEPENDENT_AMBULATORY_CARE_PROVIDER_SITE_OTHER): Payer: Self-pay

## 2012-09-06 ENCOUNTER — Encounter (INDEPENDENT_AMBULATORY_CARE_PROVIDER_SITE_OTHER): Payer: Self-pay

## 2012-09-06 NOTE — Telephone Encounter (Signed)
The pt called in requesting a return to work note for tomorrow.  You can fax it to her work # [phone] and she will get it tomorrow when she gets there.  I gave her benign pathology report since it is in the system.  She would like a copy of it when she comes in on 2/24.

## 2012-09-20 ENCOUNTER — Ambulatory Visit (INDEPENDENT_AMBULATORY_CARE_PROVIDER_SITE_OTHER): Payer: BC Managed Care – PPO | Admitting: Surgery

## 2012-09-20 ENCOUNTER — Encounter (INDEPENDENT_AMBULATORY_CARE_PROVIDER_SITE_OTHER): Payer: Self-pay | Admitting: Surgery

## 2012-09-20 VITALS — BP 120/82 | HR 89 | Temp 98.8°F | Resp 18 | Ht 67.0 in | Wt 159.2 lb

## 2012-09-20 DIAGNOSIS — Z9889 Other specified postprocedural states: Secondary | ICD-10-CM

## 2012-09-20 NOTE — Patient Instructions (Signed)
Return as needed

## 2012-09-20 NOTE — Progress Notes (Signed)
Patient returns after right breast partial mastectomy. Pathology showed a papilloma. She has some soreness.  Exam:incision clean dry intact  Impression: Status post right breast partial mastectomy for papilloma  Plan: Followup as needed.

## 2013-05-31 ENCOUNTER — Other Ambulatory Visit: Payer: Self-pay

## 2013-05-31 DIAGNOSIS — Z1231 Encounter for screening mammogram for malignant neoplasm of breast: Secondary | ICD-10-CM

## 2013-07-05 ENCOUNTER — Ambulatory Visit
Admission: RE | Admit: 2013-07-05 | Discharge: 2013-07-05 | Disposition: A | Discharge status: Home / Self Care | Payer: BC Managed Care – PPO | Source: Ambulatory Visit

## 2013-07-05 DIAGNOSIS — Z1231 Encounter for screening mammogram for malignant neoplasm of breast: Secondary | ICD-10-CM

## 2014-03-28 LAB — HM COLONOSCOPY

## 2015-04-28 LAB — HM PAP SMEAR

## 2015-04-28 LAB — HM MAMMOGRAPHY: HM Mammogram: ABNORMAL — AB (ref 0–4)

## 2015-06-07 ENCOUNTER — Other Ambulatory Visit: Payer: Self-pay | Admitting: Obstetrics and Gynecology

## 2015-06-07 DIAGNOSIS — R928 Other abnormal and inconclusive findings on diagnostic imaging of breast: Secondary | ICD-10-CM

## 2015-07-09 ENCOUNTER — Ambulatory Visit
Admission: RE | Admit: 2015-07-09 | Discharge: 2015-07-09 | Disposition: A | Discharge status: Home / Self Care | Payer: 59 | Source: Ambulatory Visit | Attending: Obstetrics and Gynecology | Admitting: Obstetrics and Gynecology

## 2015-07-09 ENCOUNTER — Other Ambulatory Visit: Payer: Self-pay | Admitting: Obstetrics and Gynecology

## 2015-07-09 DIAGNOSIS — R921 Mammographic calcification found on diagnostic imaging of breast: Secondary | ICD-10-CM

## 2015-07-09 DIAGNOSIS — R928 Other abnormal and inconclusive findings on diagnostic imaging of breast: Secondary | ICD-10-CM

## 2015-07-16 ENCOUNTER — Ambulatory Visit
Admission: RE | Admit: 2015-07-16 | Discharge: 2015-07-16 | Disposition: A | Discharge status: Home / Self Care | Payer: 59 | Source: Ambulatory Visit | Attending: Obstetrics and Gynecology | Admitting: Obstetrics and Gynecology

## 2015-07-16 DIAGNOSIS — R921 Mammographic calcification found on diagnostic imaging of breast: Secondary | ICD-10-CM

## 2015-07-17 ENCOUNTER — Other Ambulatory Visit: Payer: Self-pay | Admitting: Obstetrics and Gynecology

## 2015-07-17 DIAGNOSIS — M79622 Pain in left upper arm: Secondary | ICD-10-CM

## 2015-07-17 DIAGNOSIS — N644 Mastodynia: Secondary | ICD-10-CM

## 2015-07-24 ENCOUNTER — Ambulatory Visit
Admission: RE | Admit: 2015-07-24 | Discharge: 2015-07-24 | Disposition: A | Discharge status: Home / Self Care | Payer: 59 | Source: Ambulatory Visit | Attending: Obstetrics and Gynecology | Admitting: Obstetrics and Gynecology

## 2015-07-24 DIAGNOSIS — N644 Mastodynia: Secondary | ICD-10-CM

## 2015-07-24 DIAGNOSIS — M79622 Pain in left upper arm: Secondary | ICD-10-CM

## 2015-08-09 ENCOUNTER — Ambulatory Visit: Payer: Self-pay | Admitting: Surgery

## 2015-08-16 ENCOUNTER — Telehealth: Payer: Self-pay | Admitting: Oncology

## 2015-08-16 NOTE — Telephone Encounter (Signed)
LT MESS REGARDING NEW PT REFERRAL FOR BREAST

## 2015-08-17 ENCOUNTER — Telehealth: Payer: Self-pay | Admitting: *Deleted

## 2015-08-17 NOTE — Telephone Encounter (Signed)
Mailed welcoming packet to pt.

## 2015-08-22 ENCOUNTER — Telehealth: Payer: Self-pay | Admitting: Hematology and Oncology

## 2015-08-22 ENCOUNTER — Ambulatory Visit (HOSPITAL_BASED_OUTPATIENT_CLINIC_OR_DEPARTMENT_OTHER): Payer: BLUE CROSS/BLUE SHIELD | Admitting: Hematology and Oncology

## 2015-08-22 ENCOUNTER — Encounter: Payer: Self-pay | Admitting: Hematology and Oncology

## 2015-08-22 VITALS — BP 150/80 | HR 74 | Temp 97.9°F | Resp 18 | Ht 67.0 in | Wt 164.6 lb

## 2015-08-22 DIAGNOSIS — N6091 Unspecified benign mammary dysplasia of right breast: Secondary | ICD-10-CM | POA: Diagnosis not present

## 2015-08-22 NOTE — Progress Notes (Signed)
[facility] CONSULT NOTE  Patient Care Team: Pcp Not In System as PCP - General  CHIEF COMPLAINTS/PURPOSE OF CONSULTATION:  Newly diagnosed atypical ductal hyperplasia  HISTORY OF PRESENTING ILLNESS:  Sheryl Bryan 55 y.o. female is here because of recent diagnosis of right breast atypical ductal hyperplasia. Patient had a routine screening mammogram which revealed right breast calcifications. Biopsy was performed which came back as atypical ductal hyperplasia. Patient was reported high risk breast clinic to discuss treatment options. Dr. Brantley Stage saw the patient and recommended doing lumpectomy. I reviewed her records extensively and collaborated the history with the patient.  MEDICAL HISTORY:  Past Medical History  Diagnosis Date  . Papilloma of breast   . UTI (urinary tract infection)   . GERD (gastroesophageal reflux disease)     occ    SURGICAL HISTORY: Past Surgical History  Procedure Laterality Date  . Cesarean section  12/04/93, 01/05/79  . Endometrial ablation  2008  . Fracture surgery      rt pinkie toe  . Partial mastectomy with needle localization  09/01/2012    Procedure: PARTIAL MASTECTOMY WITH NEEDLE LOCALIZATION;  Surgeon: Marcello Moores A. Cornett, MD;  Location: [facility];  Service: General;  Laterality: Right;    SOCIAL HISTORY: Social History   Social History  . Marital Status: Married    Spouse Name: N/A  . Number of Children: N/A  . Years of Education: N/A   Occupational History  . Not on file.   Social History Main Topics  . Smoking status: Never Smoker   . Smokeless tobacco: Never Used  . Alcohol Use: No  . Drug Use: No  . Sexual Activity: Not on file   Other Topics Concern  . Not on file   Social History Narrative  . No narrative on file    FAMILY HISTORY: Family History  Problem Relation Age of Onset  . Cancer Mother     multiple myeloma  . Cancer Brother     leukemia  . Cancer Maternal Aunt     breast  . Cancer Maternal  Aunt   . COPD Maternal Aunt     breast - great aunt    ALLERGIES:  is allergic to sulfa antibiotics.  MEDICATIONS:  Current Outpatient Prescriptions  Medication Sig Dispense Refill  . ibuprofen (ADVIL,MOTRIN) 200 MG tablet Take 400 mg by mouth every 6 (six) hours as needed. For pain    . norethindrone (JOLIVETTE) 0.35 MG tablet Take 1 tablet by mouth daily.    Marland Kitchen omeprazole (PRILOSEC OTC) 20 MG tablet Take 20 mg by mouth daily.    . valACYclovir (VALTREX) 500 MG tablet Take 500 mg by mouth 2 (two) times daily as needed. For outbreaks     No current facility-administered medications for this visit.    REVIEW OF SYSTEMS:   Constitutional: Denies fevers, chills or abnormal night sweats Eyes: Denies blurriness of vision, double vision or watery eyes Ears, nose, mouth, throat, and face: Denies mucositis or sore throat Respiratory: Denies cough, dyspnea or wheezes Cardiovascular: Denies palpitation, chest discomfort or lower extremity swelling Gastrointestinal:  Denies nausea, heartburn or change in bowel habits Skin: Denies abnormal skin rashes Lymphatics: Denies new lymphadenopathy or easy bruising Neurological:Denies numbness, tingling or new weaknesses Behavioral/Psych: Mood is stable, no new changes  Breast:  Denies any palpable lumps or discharge All other systems were reviewed with the patient and are negative.  PHYSICAL EXAMINATION: ECOG PERFORMANCE STATUS: 0 - Asymptomatic  Filed Vitals:   08/22/15  1309  BP: 150/80  Pulse: 74  Temp: 97.9 F (36.6 C)  Resp: 18   Filed Weights   08/22/15 1309  Weight: 164 lb 9.6 oz (74.662 kg)    GENERAL:alert, no distress and comfortable SKIN: skin color, texture, turgor are normal, no rashes or significant lesions EYES: normal, conjunctiva are pink and non-injected, sclera clear OROPHARYNX:no exudate, no erythema and lips, buccal mucosa, and tongue normal  NECK: supple, thyroid normal size, non-tender, without nodularity LYMPH:   no palpable lymphadenopathy in the cervical, axillary or inguinal LUNGS: clear to auscultation and percussion with normal breathing effort HEART: regular rate & rhythm and no murmurs and no lower extremity edema ABDOMEN:abdomen soft, non-tender and normal bowel sounds Musculoskeletal:no cyanosis of digits and no clubbing  PSYCH: alert & oriented x 3 with fluent speech NEURO: no focal motor/sensory deficits BREAST: No palpable nodules in breast. No palpable axillary or supraclavicular lymphadenopathy (exam performed in the presence of a chaperone)   LABORATORY DATA:  I have reviewed the data as listed Lab Results  Component Value Date   WBC 4.5 08/27/2012   HGB 13.5 08/27/2012   HCT 40.6 08/27/2012   MCV 81.2 08/27/2012   PLT 234 08/27/2012   Lab Results  Component Value Date   NA 138 08/27/2012   K 4.3 08/27/2012   CL 105 08/27/2012   CO2 24 08/27/2012    RADIOGRAPHIC STUDIES: I have personally reviewed the radiological reports and agreed with the findings in the report.  ASSESSMENT AND PLAN:  Atypical ductal hyperplasia of right breast Right breast biopsy 3:00 on 07/16/2015: Right breast atypical ductal hyperplasia with calcifications and fibrocystic changes Pathology review: I discussed the difference between atypical ductal hyperplasia, DCIS and invasive breast cancer. I explained to her that atypical ductal hyperplasia is characterized by a proliferation of uniform epithelial cells filling part, but not the entirety, of the involved duct. ADH is associated with an increased risk of both ipsilateral and contralateral breast cancer and thus provides evidence of underlying breast abnormalities that predispose to breast cancer.   Prognosis:Using the American Cancer Society breast cancer risk assessment tool, her risk of breast cancer in 5 years is at 2.3% ( average woman's risk is 1.2%), her lifetime risk of breast cancers at 15.5% ( average risk is a 8.6%)  I discussed the  risks and benefits of tamoxifen therapy. I believe that the risks far outweigh any benefits from tamoxifen.  Instead I recommended the following 1. Exercise 30 minutes daily 2. Weight loss 3. Increasing fruits and vegetables and less red meat ( patient and her husband went on a fruit and vegetable diet in the fast for the whole month. They're both using 6-10 pounds easily.)  I believe all of the above would extensively decrease her risk of breast cancer as well as other cancers including cancers of the colon substantially.   Decision regarding surgery: Dr. Brantley Stage recommended doing a lumpectomy. I supported that decision. Patient will think about it. If she decides against surgery, I would like to repeat another mammogram in 6 months in follow-up. If she does undergo surgery, then I would like to repeat a mammogram in 1 year in follow-up after that. I discussed with her that the reason to surgery would be to make sure there is no evidence of DCIS or invasive breast cancer in the breast.  Surveillance plan: Annual mammograms and breast exams  All questions were answered. The patient knows to call the clinic with any problems, questions or  concerns.    Rulon Eisenmenger, MD 08/22/2015

## 2015-08-22 NOTE — Progress Notes (Signed)
Unable to get in to room prior to MD.  No assessment performed.

## 2015-08-22 NOTE — Assessment & Plan Note (Signed)
Right breast biopsy 3:00 on 07/16/2015: Right breast atypical ductal hyperplasia with calcifications and fibrocystic changes Pathology review: I discussed the difference between atypical ductal hyperplasia, DCIS and invasive breast cancer. I explained to her that atypical ductal hyperplasia is characterized by a proliferation of uniform epithelial cells filling part, but not the entirety, of the involved duct. ADH is associated with an increased risk of both ipsilateral and contralateral breast cancer and thus provides evidence of underlying breast abnormalities that predispose to breast cancer.   Prognosis:Using the American Cancer Society breast cancer risk assessment tool, her risk of breast cancer in 5 years is at 2.3% ( average woman's risk is 1.2%), her lifetime risk of breast cancers at 15.5% ( average risk is a 8.6%)  I discussed the risks and benefits of tamoxifen therapy. I believe that the risks far outweigh any benefits from tamoxifen. Instead I recommended the following 1. Exercise 30 minutes daily 2. Weight loss 3. Increasing fruits and vegetables and less red meat I believe all of the above would extensively decrease her risk of breast cancer as well as other cancers including cancers of the colon substantially.   Decision regarding surgery: Dr. Brantley Stage recommended doing a lumpectomy. I supported that decision. Patient will think about it. If she decides against surgery, I would like to repeat another mammogram in 6 months in follow-up. If she does undergo surgery, then I would like to repeat a mammogram in 1 year in follow-up after that.  Surveillance plan: Annual mammograms and breast exams

## 2015-08-22 NOTE — Telephone Encounter (Signed)
Gave patient avs report and appointments for January 2018.  °

## 2016-02-14 ENCOUNTER — Ambulatory Visit (INDEPENDENT_AMBULATORY_CARE_PROVIDER_SITE_OTHER): Payer: BLUE CROSS/BLUE SHIELD | Admitting: Family Medicine

## 2016-02-14 ENCOUNTER — Encounter: Payer: Self-pay | Admitting: Family Medicine

## 2016-02-14 VITALS — BP 137/75 | HR 79 | Temp 98.4°F | Ht 67.0 in | Wt 168.8 lb

## 2016-02-14 DIAGNOSIS — J329 Chronic sinusitis, unspecified: Secondary | ICD-10-CM | POA: Diagnosis not present

## 2016-02-14 DIAGNOSIS — N390 Urinary tract infection, site not specified: Secondary | ICD-10-CM

## 2016-02-14 DIAGNOSIS — R0982 Postnasal drip: Secondary | ICD-10-CM

## 2016-02-14 LAB — POC URINALSYSI DIPSTICK (AUTOMATED)
Bilirubin, UA: NEGATIVE
Blood, UA: NEGATIVE
Glucose, UA: NEGATIVE
Ketones, UA: NEGATIVE
Leukocytes, UA: NEGATIVE
Nitrite, UA: NEGATIVE
Protein, UA: NEGATIVE
Spec Grav, UA: >=1.03
Urobilinogen, UA: NEGATIVE
pH, UA: 6

## 2016-02-14 MED ORDER — IPRATROPIUM BROMIDE 0.03 % NA SOLN: NASAL | Status: DC

## 2016-02-14 MED ORDER — NITROFURANTOIN MONOHYD MACRO 100 MG PO CAPS: 100.0000 mg | ORAL_CAPSULE | Freq: Two times a day (BID) | ORAL | Status: DC

## 2016-02-14 NOTE — Progress Notes (Addendum)
[facility] [address], [place], [place] [address] [phone]  Date:  02/14/2016   Name:  Sheryl Bryan   DOB:  1960/12/25   MRN:  [id number]  PCP:  Pcp Not In System    Chief Complaint: Urinary Tract Infection   History of Present Illness:  Sheryl Bryan is a 55 y.o. very pleasant female patient who presents with the following:  Here today to discuss possible UTI- first visit as a new patient.    She has noted sx of possible UTI including lower back pain, dysuria, urgency.  The urine appears normal to her.  She has noted sx for 2-3 days, no hematuria.  No belly pain.   Eating ok, no fever, chills or vomiting  She has noted a minimal vaginal discharge but thought she might be getting an HSV outbreak.  She started on her valtrex as soon as she was able- she was out of town when she first noted the sx and did not have her meds with her  Also she had a cold back in May- she got better after a couple of weeks, but she will continue to have some PND that can trigger a cough.  This is more at night. No ST, no earache.  The cough is generally dry. She feels like she has PND.    She is post- menopause and is off OCP.,  She does have some hot flashes now at night  She has had some breast concerns- not cancer, but has needed to have a ductal hyperplasia removed and had a partial right mastectomy.  She continues to follow-up and has an appt with oncology in January   Patient Active Problem List   Diagnosis Date Noted  . Atypical ductal hyperplasia of right breast 08/22/2015  . Post-operative state 09/20/2012    Past Medical History  Diagnosis Date  . Papilloma of breast   . UTI (urinary tract infection)   . GERD (gastroesophageal reflux disease)     occ    Past Surgical History  Procedure Laterality Date  . Cesarean section  12/04/93, 01/05/79  . Endometrial ablation  2008  . Fracture surgery      rt pinkie  toe  . Partial mastectomy with needle localization  09/01/2012    Procedure: PARTIAL MASTECTOMY WITH NEEDLE LOCALIZATION;  Surgeon: Marcello Moores A. Cornett, MD;  Location: [facility];  Service: General;  Laterality: Right;    Social History  Substance Use Topics  . Smoking status: Never Smoker   . Smokeless tobacco: Never Used  . Alcohol Use: No    Family History  Problem Relation Age of Onset  . Cancer Mother     multiple myeloma  . Cancer Brother     leukemia  . Cancer Maternal Aunt     breast  . Cancer Maternal Aunt   . COPD Maternal Aunt     breast - great aunt    Allergies  Allergen Reactions  . Sulfa Antibiotics Hives    Spreads all over the body    Medication list has been reviewed and updated.  Current Outpatient Prescriptions on File Prior to Visit  Medication Sig Dispense Refill  . ibuprofen (ADVIL,MOTRIN) 200 MG tablet Take 400 mg by mouth every 6 (six) hours as needed. For pain    . omeprazole (PRILOSEC OTC) 20 MG tablet Take 20 mg by mouth daily as needed.     . valACYclovir (  VALTREX) 500 MG tablet Take 500 mg by mouth 2 (two) times daily as needed. For outbreaks     No current facility-administered medications on file prior to visit.    Review of Systems:  As per HPI- otherwise negative. She works in [employer]- she works for a [employer].  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   Married, she has 2 children., 47 and 53 years old. They are both doing well.  Her daughter lives in [place] and has 2 children, her son just graduated and moved to [place]  Physical Examination: Filed Vitals:   02/14/16 1833  BP: 137/75  Pulse: 79  Temp: 98.4 F (36.9 C)    Ideal Body Weight:    GEN: WDWN, NAD, Non-toxic, A & O x 3, looks well and younger than age 71: Atraumatic, Normocephalic. Neck supple. No masses, No LAD.  Bilateral TM wnl, oropharynx normal.  PEERL,EOMI.   Ears and Nose: No external deformity. CV: RRR, No  M/G/R. No JVD. No thrill. No extra heart sounds. PULM: CTA B, no wheezes, crackles, rhonchi. No retractions. No resp. distress. No accessory muscle use. ABD: S, NT, ND. No rebound. No HSM. EXTR: No c/c/e NEURO Normal gait.  PSYCH: Normally interactive. Conversant. Not depressed or anxious appearing.  Calm demeanor.  No CVA tenderness, benign belly Pelvic: normal, no vaginal lesions or discharge. Uterus normal, no CMT, no adnexal tendereness or masses  Assessment and Plan: Urinary tract infection, site not specified - Plan: POCT Urinalysis Dipstick (Automated), Urine culture, nitrofurantoin, macrocrystal-monohydrate, (MACROBID) 100 MG capsule  Post-nasal drainage - Plan: ipratropium (ATROVENT) 0.03 % nasal spray  Treat for presumed UTI with macrobid, atrovent nasal as needed for PND She will let me know if not improving, await culture   At this time no vaginal lesions noted but she will let me know if sx persist  Signed Lamar Blinks, MD  Received her urine culture on 7/24- negative.  Called her and LMOM. Let me know if any symptoms continue  Results for orders placed or performed in visit on 02/14/16  Urine culture  Result Value Ref Range   Organism ID, Bacteria NO GROWTH   POCT Urinalysis Dipstick (Automated)  Result Value Ref Range   Color, UA Yellow    Clarity, UA clear    Glucose, UA negative    Bilirubin, UA negative    Ketones, UA negative    Spec Grav, UA >=1.030    Blood, UA negative    pH, UA 6.0    Protein, UA negative    Urobilinogen, UA negative    Nitrite, UA negative    Leukocytes, UA Negative Negative

## 2016-02-14 NOTE — Patient Instructions (Signed)
Your urine dip looks clear, but we will send it for a culture and start antibiotics for a presumed UTI in the meantime Use the macrobid twice a day for one week Try the atrovent nasal as needed- up to 4x a day- for post nasal drainage and cough I will be in touch with your urine culture asap  I do not see any lesions, but let me know if you have any persistent discomfort

## 2016-02-17 LAB — URINE CULTURE: Organism ID, Bacteria: NO GROWTH

## 2016-03-26 ENCOUNTER — Encounter: Payer: Self-pay | Admitting: Behavioral Health

## 2016-03-26 ENCOUNTER — Telehealth: Payer: Self-pay | Admitting: Behavioral Health

## 2016-03-26 NOTE — Telephone Encounter (Signed)
Pre-Visit Call completed with patient and chart updated.   Pre-Visit Info documented in Specialty Comments under [facility].    

## 2016-03-27 ENCOUNTER — Encounter: Payer: Self-pay | Admitting: Family Medicine

## 2016-03-27 ENCOUNTER — Ambulatory Visit (INDEPENDENT_AMBULATORY_CARE_PROVIDER_SITE_OTHER): Payer: BLUE CROSS/BLUE SHIELD | Admitting: Family Medicine

## 2016-03-27 VITALS — BP 112/74 | HR 90 | Temp 98.4°F | Ht 67.0 in | Wt 169.0 lb

## 2016-03-27 DIAGNOSIS — M79641 Pain in right hand: Secondary | ICD-10-CM | POA: Diagnosis not present

## 2016-03-27 DIAGNOSIS — N951 Menopausal and female climacteric states: Secondary | ICD-10-CM

## 2016-03-27 DIAGNOSIS — Z130 Encounter for screening for diseases of the blood and blood-forming organs and certain disorders involving the immune mechanism: Secondary | ICD-10-CM

## 2016-03-27 DIAGNOSIS — Z13220 Encounter for screening for lipoid disorders: Secondary | ICD-10-CM | POA: Diagnosis not present

## 2016-03-27 DIAGNOSIS — Z131 Encounter for screening for diabetes mellitus: Secondary | ICD-10-CM | POA: Diagnosis not present

## 2016-03-27 DIAGNOSIS — Z566 Other physical and mental strain related to work: Secondary | ICD-10-CM | POA: Diagnosis not present

## 2016-03-27 DIAGNOSIS — M79642 Pain in left hand: Secondary | ICD-10-CM | POA: Diagnosis not present

## 2016-03-27 DIAGNOSIS — G4700 Insomnia, unspecified: Secondary | ICD-10-CM

## 2016-03-27 DIAGNOSIS — R232 Flushing: Secondary | ICD-10-CM

## 2016-03-27 LAB — TSH: TSH: 0.44 u[IU]/mL (ref 0.35–4.50)

## 2016-03-27 LAB — COMPREHENSIVE METABOLIC PANEL
ALT: 8 U/L (ref 0–35)
AST: 16 U/L (ref 0–37)
Albumin: 4.1 g/dL (ref 3.5–5.2)
Alkaline Phosphatase: 52 U/L (ref 39–117)
BUN: 11 mg/dL (ref 6–23)
CO2: 30 mEq/L (ref 19–32)
Calcium: 9.1 mg/dL (ref 8.4–10.5)
Chloride: 107 mEq/L (ref 96–112)
Creatinine, Ser: 0.83 mg/dL (ref 0.40–1.20)
GFR: 92.52 mL/min (ref >60.00)
Glucose, Bld: 69 mg/dL — ABNORMAL LOW (ref 70–99)
Potassium: 4.2 mEq/L (ref 3.5–5.1)
Sodium: 142 mEq/L (ref 135–145)
Total Bilirubin: 0.4 mg/dL (ref 0.2–1.2)
Total Protein: 6.6 g/dL (ref 6.0–8.3)

## 2016-03-27 LAB — CBC
HCT: 39 % (ref 36.0–46.0)
Hemoglobin: 12.8 g/dL (ref 12.0–15.0)
MCHC: 32.9 g/dL (ref 30.0–36.0)
MCV: 79.3 fl (ref 78.0–100.0)
Platelets: 215 10*3/uL (ref 150.0–400.0)
RBC: 4.91 Mil/uL (ref 3.87–5.11)
RDW: 14.6 % (ref 11.5–15.5)
WBC: 4.3 10*3/uL (ref 4.0–10.5)

## 2016-03-27 LAB — LIPID PANEL
Cholesterol: 171 mg/dL (ref 0–200)
HDL: 55.6 mg/dL (ref >39.00)
LDL Cholesterol: 101 mg/dL — ABNORMAL HIGH (ref 0–99)
NonHDL: 115.59
Total CHOL/HDL Ratio: 3
Triglycerides: 73 mg/dL (ref 0.0–149.0)
VLDL: 14.6 mg/dL (ref 0.0–40.0)

## 2016-03-27 LAB — HEMOGLOBIN A1C: Hgb A1c MFr Bld: 6 % (ref 4.6–6.5)

## 2016-03-27 LAB — HIV ANTIBODY (ROUTINE TESTING W REFLEX): HIV 1&2 Ab, 4th Generation: NONREACTIVE

## 2016-03-27 LAB — RHEUMATOID FACTOR: Rhuematoid fact SerPl-aCnc: <10 IU/mL (ref <=14)

## 2016-03-27 LAB — SEDIMENTATION RATE: Sed Rate: 17 mm/hr (ref 0–30)

## 2016-03-27 NOTE — Progress Notes (Signed)
[facility] [address], [address] [place], [place] [address] [phone]  Date:  03/27/2016   Name:  Sheryl Bryan   DOB:  1960-08-05   MRN:  [id number]  PCP:  Pcp Not In System    Chief Complaint: Establish Care (Pt here to est care. c/o trouble falling asleep x 6 months. pt will get flu vaccine at work in Sept. )   History of Present Illness:  Sheryl Bryan is a 55 y.o. very pleasant female patient who presents with the following:  Pt seen here last month for a UTI.  She is here today to establish care formally and to discuss  Her job is pretty stressful, she has noted some issues with insomnia which she thinks may be related to menopause and to stress.  She has noted this issue off and on for 6 months or so.   She first started to notice hot flashes/ night sweats a little over a year ago. She tried an OTC herbal treatment from [facility]. However as her sx got better, she stopped using this  She will have occasional daytime hot flashes but they are much worse at night.   She will usually get to bed around 11- 11:30, and may fall asleep up to an hour later.   She has to get up around 5 if she is exercising or 6:30 if not exercising that day. Once she falls asleep she will sleep pretty well.  When she gets into bed she will start to feel hot and "will have to throw the covers off," she will occasionally wake up in a sweat.  She has noted a lot more stress over the last 2-3 months, this can make her feel down.  Anxiety is more associated with her job than her home life.  She may have felt "a little" depressed which is not usual for her.    However she denies any possibility of SI- "I would never do that"  She is not fasting today  She has her OBG visit in November and they will do her pap and mammogram for her. She plans to get her flu shot in September at work  She has also noted some occasional joint pains especially in her hands  and wrists. She has attributed this to working out in general. It is not a consistent issue for her  Patient Active Problem List   Diagnosis Date Noted  . Atypical ductal hyperplasia of right breast 08/22/2015  . Post-operative state 09/20/2012    Past Medical History:  Diagnosis Date  . GERD (gastroesophageal reflux disease)    occ  . Papilloma of breast   . UTI (urinary tract infection)     Past Surgical History:  Procedure Laterality Date  . CESAREAN SECTION  12/04/93, 01/05/79  . ENDOMETRIAL ABLATION  2008  . FRACTURE SURGERY     rt pinkie toe  . PARTIAL MASTECTOMY WITH NEEDLE LOCALIZATION  09/01/2012   Procedure: PARTIAL MASTECTOMY WITH NEEDLE LOCALIZATION;  Surgeon: Maisie Fus A. Cornett, MD;  Location: [facility];  Service: General;  Laterality: Right;    Social History  Substance Use Topics  . Smoking status: Never Smoker  . Smokeless tobacco: Never Used  . Alcohol use No    Family History  Problem Relation Age of Onset  . Cancer Mother     multiple myeloma  . Cancer Brother     leukemia  . Cancer Maternal Aunt  breast  . Cancer Maternal Aunt   . COPD Maternal Aunt     breast - great aunt    Allergies  Allergen Reactions  . Sulfa Antibiotics Hives    Spreads all over the body    Medication list has been reviewed and updated.  Current Outpatient Prescriptions on File Prior to Visit  Medication Sig Dispense Refill  . ipratropium (ATROVENT) 0.03 % nasal spray Use 2 sprays in each nostril up to 4x a day as needed 30 mL 6  . omeprazole (PRILOSEC OTC) 20 MG tablet Take 20 mg by mouth daily as needed.     . valACYclovir (VALTREX) 500 MG tablet Take 500 mg by mouth 2 (two) times daily as needed. For outbreaks     No current facility-administered medications on file prior to visit.     Review of Systems:  As per HPI- otherwise negative.   Physical Examination: Vitals:   03/27/16 0843  BP: 112/74  Pulse: 90  Temp: 98.4 F (36.9 C)   Vitals:   03/27/16  0843  Weight: 169 lb (76.7 kg)  Height: 5' 7" (1.702 m)   Body mass index is 26.47 kg/m. Ideal Body Weight: Weight in (lb) to have BMI = 25: 159.3  GEN: WDWN, NAD, Non-toxic, A & O x 3, looks well HEENT: Atraumatic, Normocephalic. Neck supple. No masses, No LAD. Bilateral TM wnl, oropharynx normal.  PEERL,EOMI.   Ears and Nose: No external deformity. CV: RRR, No M/G/R. No JVD. No thrill. No extra heart sounds. PULM: CTA B, no wheezes, crackles, rhonchi. No retractions. No resp. distress. No accessory muscle use. ABD: S, NT, ND. No rebound. No HSM. EXTR: No c/c/e.  Normal hands and wrists bilaterally- no stiffnes, swelling, heat, or ulnar daivation NEURO Normal gait.  PSYCH: Normally interactive. Conversant. Not depressed or anxious appearing.  Calm demeanor.    Assessment and Plan: Insomnia - Plan: TSH  Hot flashes - Plan: HIV antibody  Work-related stress  Bilateral hand pain - Plan: Sed Rate (ESR), Cyclic citrul peptide antibody, IgG, Rheumatoid Factor  Screening for deficiency anemia - Plan: CBC  Screening for diabetes mellitus - Plan: Comprehensive metabolic panel, Hemoglobin A1c  Screening for hyperlipidemia - Plan: Lipid panel  Here today with insomnia related to hot flashes at night/ menopausal sx and also work stress.  Discussed various treatment options including SSRI, hormone replacement. She would like to try and minimize medications and is not interested in these drugs.  Discussed melatonin and good sleepy hygiene. She will work on this and let me know if she is not improving.   Also will check labs to look for any sign of RA, but suspect she has mild OA in her hands She is feeling "a bit down" but does not feel that she is depressed and does not currently desire treatment for same.  She will let me know if any change   Signed Lamar Blinks, MD

## 2016-03-27 NOTE — Patient Instructions (Signed)
It was great to see you again today I'll be in touch with your labs asap We did all your basic blood work today, and also labs to look for any cause of your hand pain.   It is standard to check an HIV in people with "nightsweats" but of course we expect this will be negative!  Try using an OTC melatonin supplement- the dissolvable tablets are often best- at a consistent time in the evening about 30 minutes prior to when you would like to be asleep.   Also, try to get closer to 7 or 8 hours of sleep most nights if you can!

## 2016-03-27 NOTE — Progress Notes (Signed)
Pre visit review using our clinic review tool, if applicable. No additional management support is needed unless otherwise documented below in the visit note. 

## 2016-03-28 ENCOUNTER — Encounter: Payer: Self-pay | Admitting: Family Medicine

## 2016-03-28 LAB — CYCLIC CITRUL PEPTIDE ANTIBODY, IGG: Cyclic Citrullin Peptide Ab: <16 Units

## 2016-05-28 DIAGNOSIS — Z1231 Encounter for screening mammogram for malignant neoplasm of breast: Secondary | ICD-10-CM | POA: Diagnosis not present

## 2016-05-28 DIAGNOSIS — Z01419 Encounter for gynecological examination (general) (routine) without abnormal findings: Secondary | ICD-10-CM | POA: Diagnosis not present

## 2016-05-28 DIAGNOSIS — Z6826 Body mass index (BMI) 26.0-26.9, adult: Secondary | ICD-10-CM | POA: Diagnosis not present

## 2016-05-28 DIAGNOSIS — Z1151 Encounter for screening for human papillomavirus (HPV): Secondary | ICD-10-CM | POA: Diagnosis not present

## 2016-05-30 ENCOUNTER — Ambulatory Visit (INDEPENDENT_AMBULATORY_CARE_PROVIDER_SITE_OTHER): Payer: BLUE CROSS/BLUE SHIELD | Admitting: Physician Assistant

## 2016-05-30 ENCOUNTER — Encounter: Payer: Self-pay | Admitting: Physician Assistant

## 2016-05-30 ENCOUNTER — Other Ambulatory Visit: Payer: Self-pay | Admitting: Obstetrics and Gynecology

## 2016-05-30 VITALS — BP 128/83 | HR 98 | Temp 98.7°F | Resp 16 | Ht 67.0 in | Wt 169.5 lb

## 2016-05-30 DIAGNOSIS — B9689 Other specified bacterial agents as the cause of diseases classified elsewhere: Secondary | ICD-10-CM | POA: Diagnosis not present

## 2016-05-30 DIAGNOSIS — J019 Acute sinusitis, unspecified: Secondary | ICD-10-CM

## 2016-05-30 DIAGNOSIS — R928 Other abnormal and inconclusive findings on diagnostic imaging of breast: Secondary | ICD-10-CM

## 2016-05-30 MED ORDER — AMOXICILLIN-POT CLAVULANATE 875-125 MG PO TABS: 1.0000 | ORAL_TABLET | Freq: Two times a day (BID) | ORAL | 0 refills | Status: DC

## 2016-05-30 NOTE — Progress Notes (Signed)
Patient presents to clinic today c/o 1 weeks of runny nose with PND and sore throat. Endorses acutely worsening about 4 days ago. Now with sinus pressure, pain (right-sided), with ear pressure and pain. Endorses cough that is mainly dry. Notes some chills and sweats but unsure of fever. Denies recent travel or sick contact. Patient has been taken Zyrtec with little relief in symptoms.  Past Medical History:  Diagnosis Date  . GERD (gastroesophageal reflux disease)    occ  . Papilloma of breast   . UTI (urinary tract infection)     Current Outpatient Prescriptions on File Prior to Visit  Medication Sig Dispense Refill  . ipratropium (ATROVENT) 0.03 % nasal spray Use 2 sprays in each nostril up to 4x a day as needed 30 mL 6  . omeprazole (PRILOSEC OTC) 20 MG tablet Take 20 mg by mouth daily as needed.     . valACYclovir (VALTREX) 500 MG tablet Take 500 mg by mouth 2 (two) times daily as needed. For outbreaks     No current facility-administered medications on file prior to visit.     Allergies  Allergen Reactions  . Sulfa Antibiotics Hives    Spreads all over the body    Family History  Problem Relation Age of Onset  . Cancer Mother     multiple myeloma  . Cancer Brother     leukemia  . Cancer Maternal Aunt     breast  . Cancer Maternal Aunt   . COPD Maternal Aunt     breast - great aunt    Social History   Social History  . Marital status: Married    Spouse name: N/A  . Number of children: N/A  . Years of education: N/A   Social History Main Topics  . Smoking status: Never Smoker  . Smokeless tobacco: Never Used  . Alcohol use No  . Drug use: No  . Sexual activity: Not Asked   Other Topics Concern  . None   Social History Narrative  . None   Review of Systems - See HPI.  All other ROS are negative.  BP 128/83 (BP Location: Left Arm, Patient Position: Sitting, Cuff Size: Normal)   Pulse 98   Temp 98.7 F (37.1 C) (Oral)   Resp 16   Ht _0  (1.702  m)   Wt 169 lb 8 oz (76.9 kg)   SpO2 98%   BMI 26.55 kg/m   Physical Exam  Constitutional: She is oriented to person, place, and time and well-developed, well-nourished, and in no distress.  Eyes: Conjunctivae are normal.  Neck: Neck supple.  Cardiovascular: Normal rate, regular rhythm, normal heart sounds and intact distal pulses.   Pulmonary/Chest: Effort normal and breath sounds normal. No respiratory distress. She has no wheezes. She has no rales. She exhibits no tenderness.  Lymphadenopathy:    She has no cervical adenopathy.  Neurological: She is alert and oriented to person, place, and time.  Skin: Skin is warm and dry. No rash noted.  Psychiatric: Affect normal.  Vitals reviewed.   Recent Results (from the past 2160 hour(s))  CBC     Status: None   Collection Time: 03/27/16  9:18 AM  Result Value Ref Range   WBC 4.3 4.0 - 10.5 K/uL   RBC 4.91 3.87 - 5.11 Mil/uL   Platelets 215.0 150.0 - 400.0 K/uL   Hemoglobin 12.8 12.0 - 15.0 g/dL   HCT 39.0 36.0 - 46.0 %   MCV 79.3  78.0 - 100.0 fl   MCHC 32.9 30.0 - 36.0 g/dL   RDW 14.6 11.5 - 15.5 %  Comprehensive metabolic panel     Status: Abnormal   Collection Time: 03/27/16  9:18 AM  Result Value Ref Range   Sodium 142 135 - 145 mEq/L   Potassium 4.2 3.5 - 5.1 mEq/L   Chloride 107 96 - 112 mEq/L   CO2 30 19 - 32 mEq/L   Glucose, Bld 69 (L) 70 - 99 mg/dL   BUN 11 6 - 23 mg/dL   Creatinine, Ser 0.83 0.40 - 1.20 mg/dL   Total Bilirubin 0.4 0.2 - 1.2 mg/dL   Alkaline Phosphatase 52 39 - 117 U/L   AST 16 0 - 37 U/L   ALT 8 0 - 35 U/L   Total Protein 6.6 6.0 - 8.3 g/dL   Albumin 4.1 3.5 - 5.2 g/dL   Calcium 9.1 8.4 - 10.5 mg/dL   GFR 92.52 >60.00 mL/min  Lipid panel     Status: Abnormal   Collection Time: 03/27/16  9:18 AM  Result Value Ref Range   Cholesterol 171 0 - 200 mg/dL    Comment: ATP III Classification       Desirable:  < 200 mg/dL               Borderline High:  200 - 239 mg/dL          High:  > = 240 mg/dL    Triglycerides 73.0 0.0 - 149.0 mg/dL    Comment: Normal:  <150 mg/dLBorderline High:  150 - 199 mg/dL   HDL 55.60 >39.00 mg/dL   VLDL 14.6 0.0 - 40.0 mg/dL   LDL Cholesterol 101 (H) 0 - 99 mg/dL   Total CHOL/HDL Ratio 3     Comment:                Men          Women1/2 Average Risk     3.4          3.3Average Risk          5.0          4.42X Average Risk          9.6          7.13X Average Risk          15.0          11.0                       NonHDL 115.59     Comment: NOTE:  Non-HDL goal should be 30 mg/dL higher than patient's LDL goal (i.e. LDL goal of < 70 mg/dL, would have non-HDL goal of < 100 mg/dL)  TSH     Status: None   Collection Time: 03/27/16  9:18 AM  Result Value Ref Range   TSH 0.44 0.35 - 4.50 uIU/mL  Sed Rate (ESR)     Status: None   Collection Time: 03/27/16  9:18 AM  Result Value Ref Range   Sed Rate 17 0 - 30 mm/hr  Cyclic citrul peptide antibody, IgG     Status: None   Collection Time: 03/27/16  9:18 AM  Result Value Ref Range   Cyclic Citrullin Peptide Ab <16 Units    Comment:   Reference Range Negative               < 20 Weak Positive  20 - 39 Moderate Positive        40 - 59 Strong Positive        > 59   Rheumatoid Factor     Status: None   Collection Time: 03/27/16  9:18 AM  Result Value Ref Range   Rhuematoid fact SerPl-aCnc <10 <=14 IU/mL    Comment:                            Interpretive Table                     Low Positive: 15 - 41 IU/mL                     High Positive:  >= 42 IU/mL    In addition to the RF result, and clinical symptoms including joint  involvement, the 2010 ACR Classification Criteria for  scoring/diagnosing Rheumatoid Arthritis include the results of the  following tests:  CRP (25498), ESR (15010), and CCP (APCA) (26415).  www.rheumatology.org/practice/clinical/classification/ra/ra_2010.asp   Hemoglobin A1c     Status: None   Collection Time: 03/27/16  9:18 AM  Result Value Ref Range   Hgb A1c MFr Bld 6.0  4.6 - 6.5 %    Comment: Glycemic Control Guidelines for People with Diabetes:Non Diabetic:  <6%Goal of Therapy: <7%Additional Action Suggested:  >8%   HIV antibody     Status: None   Collection Time: 03/27/16  9:18 AM  Result Value Ref Range   HIV 1&2 Ab, 4th Generation NONREACTIVE NONREACTIVE    Comment:   HIV-1 antigen and HIV-1/HIV-2 antibodies were not detected.  There is no laboratory evidence of HIV infection.   HIV-1/2 Antibody Diff        Not indicated. HIV-1 RNA, Qual TMA          Not indicated.     PLEASE NOTE: This information has been disclosed to you from records whose confidentiality may be protected by state law. If your state requires such protection, then the state law prohibits you from making any further disclosure of the information without the specific written consent of the person to whom it pertains, or as otherwise permitted by law. A general authorization for the release of medical or other information is NOT sufficient for this purpose.   The performance of this assay has not been clinically validated in patients less than 24 years old.   For additional information please refer to http://education.questdiagnostics.com/faq/FAQ106.  (This link is being provided for informational/educational purposes only.)       Assessment/Plan: 1. Acute bacterial sinusitis Rx Augmentin.  Increase fluids.  Rest.  Saline nasal spray.  Probiotic.  Mucinex as directed.  Humidifier in bedroom. Supportive measures reviewed.  Call or return to clinic if symptoms are not improving.  - amoxicillin-clavulanate (AUGMENTIN) 875-125 MG tablet; Take 1 tablet by mouth 2 (two) times daily.  Dispense: 14 tablet; Refill: 0   Leeanne Rio, [place]

## 2016-05-30 NOTE — Patient Instructions (Signed)
Please take antibiotic as directed.  Increase fluid intake.  Use Saline nasal spray.  Take a daily multivitamin. Delsym for cough.  Place a humidifier in the bedroom.  Please call or return clinic if symptoms are not improving.  Sinusitis Sinusitis is redness, soreness, and swelling (inflammation) of the paranasal sinuses. Paranasal sinuses are air pockets within the bones of your face (beneath the eyes, the middle of the forehead, or above the eyes). In healthy paranasal sinuses, mucus is able to drain out, and air is able to circulate through them by way of your nose. However, when your paranasal sinuses are inflamed, mucus and air can become trapped. This can allow bacteria and other germs to grow and cause infection. Sinusitis can develop quickly and last only a short time (acute) or continue over a long period (chronic). Sinusitis that lasts for more than 12 weeks is considered chronic.  CAUSES  Causes of sinusitis include:  Allergies.  Structural abnormalities, such as displacement of the cartilage that separates your nostrils (deviated septum), which can decrease the air flow through your nose and sinuses and affect sinus drainage.  Functional abnormalities, such as when the small hairs (cilia) that line your sinuses and help remove mucus do not work properly or are not present. SYMPTOMS  Symptoms of acute and chronic sinusitis are the same. The primary symptoms are pain and pressure around the affected sinuses. Other symptoms include:  Upper toothache.  Earache.  Headache.  Bad breath.  Decreased sense of smell and taste.  A cough, which worsens when you are lying flat.  Fatigue.  Fever.  Thick drainage from your nose, which often is green and may contain pus (purulent).  Swelling and warmth over the affected sinuses. DIAGNOSIS  Your caregiver will perform a physical exam. During the exam, your caregiver may:  Look in your nose for signs of abnormal growths in your  nostrils (nasal polyps).  Tap over the affected sinus to check for signs of infection.  View the inside of your sinuses (endoscopy) with a special imaging device with a light attached (endoscope), which is inserted into your sinuses. If your caregiver suspects that you have chronic sinusitis, one or more of the following tests may be recommended:  Allergy tests.  Nasal culture A sample of mucus is taken from your nose and sent to a lab and screened for bacteria.  Nasal cytology A sample of mucus is taken from your nose and examined by your caregiver to determine if your sinusitis is related to an allergy. TREATMENT  Most cases of acute sinusitis are related to a viral infection and will resolve on their own within 10 days. Sometimes medicines are prescribed to help relieve symptoms (pain medicine, decongestants, nasal steroid sprays, or saline sprays).  However, for sinusitis related to a bacterial infection, your caregiver will prescribe antibiotic medicines. These are medicines that will help kill the bacteria causing the infection.  Rarely, sinusitis is caused by a fungal infection. In theses cases, your caregiver will prescribe antifungal medicine. For some cases of chronic sinusitis, surgery is needed. Generally, these are cases in which sinusitis recurs more than 3 times per year, despite other treatments. HOME CARE INSTRUCTIONS   Drink plenty of water. Water helps thin the mucus so your sinuses can drain more easily.  Use a humidifier.  Inhale steam 3 to 4 times a day (for example, sit in the bathroom with the shower running).  Apply a warm, moist washcloth to your face 3 to 4   times a day, or as directed by your caregiver.  Use saline nasal sprays to help moisten and clean your sinuses.  Take over-the-counter or prescription medicines for pain, discomfort, or fever only as directed by your caregiver. SEEK IMMEDIATE MEDICAL CARE IF:  You have increasing pain or severe  headaches.  You have nausea, vomiting, or drowsiness.  You have swelling around your face.  You have vision problems.  You have a stiff neck.  You have difficulty breathing. MAKE SURE YOU:   Understand these instructions.  Will watch your condition.  Will get help right away if you are not doing well or get worse. Document Released: 07/14/2005 Document Revised: 10/06/2011 Document Reviewed: 07/29/2011 ExitCare Patient Information 2014 [employer].   

## 2016-05-30 NOTE — Progress Notes (Signed)
  6Pre visit review using our clinic review tool, if applicable. No additional management support is needed unless otherwise documented below in the visit note/SLS

## 2016-06-10 ENCOUNTER — Ambulatory Visit
Admission: RE | Admit: 2016-06-10 | Discharge: 2016-06-10 | Disposition: A | Discharge status: Home / Self Care | Payer: BLUE CROSS/BLUE SHIELD | Source: Ambulatory Visit | Attending: Obstetrics and Gynecology | Admitting: Obstetrics and Gynecology

## 2016-06-10 ENCOUNTER — Other Ambulatory Visit: Payer: Self-pay | Admitting: Obstetrics and Gynecology

## 2016-06-10 DIAGNOSIS — R928 Other abnormal and inconclusive findings on diagnostic imaging of breast: Secondary | ICD-10-CM

## 2016-06-10 DIAGNOSIS — R921 Mammographic calcification found on diagnostic imaging of breast: Secondary | ICD-10-CM | POA: Diagnosis not present

## 2016-06-13 ENCOUNTER — Ambulatory Visit
Admission: RE | Admit: 2016-06-13 | Discharge: 2016-06-13 | Disposition: A | Discharge status: Home / Self Care | Payer: BLUE CROSS/BLUE SHIELD | Source: Ambulatory Visit | Attending: Obstetrics and Gynecology | Admitting: Obstetrics and Gynecology

## 2016-06-13 ENCOUNTER — Other Ambulatory Visit: Payer: Self-pay | Admitting: Obstetrics and Gynecology

## 2016-06-13 DIAGNOSIS — R928 Other abnormal and inconclusive findings on diagnostic imaging of breast: Secondary | ICD-10-CM

## 2016-06-13 DIAGNOSIS — N6091 Unspecified benign mammary dysplasia of right breast: Secondary | ICD-10-CM | POA: Diagnosis not present

## 2016-06-13 DIAGNOSIS — N6011 Diffuse cystic mastopathy of right breast: Secondary | ICD-10-CM | POA: Diagnosis not present

## 2016-06-13 DIAGNOSIS — R921 Mammographic calcification found on diagnostic imaging of breast: Secondary | ICD-10-CM | POA: Diagnosis not present

## 2016-06-27 ENCOUNTER — Ambulatory Visit: Payer: Self-pay | Admitting: Surgery

## 2016-06-27 DIAGNOSIS — N6091 Unspecified benign mammary dysplasia of right breast: Secondary | ICD-10-CM

## 2016-06-27 NOTE — H&P (Signed)
Sheryl Bryan 06/27/2016 10:14 AM Location: [facility] Patient #: [id number] DOB: 1961-04-22 Married / Language: English / Race: Black or African American Female  History of Present Illness ([name] A. Kelton Bultman MD; 06/27/2016 11:48 AM) Patient words: Patient returns for follow-up of her atypical ductal hyperplasia diagnosed in 2016 by core biopsy and mammogram. She was sent to the high risk clinic and seen in felt to have a mild increase in her risk of breast cancer from her evaluation. I offered a lumpectomy last year but she decided to wait. She returned for a six-month follow-up mammogram the calcifications had gone from 4.6 cm to 4 cm. There were a few areas that were more pleomorphic and core biopsy was done in November 2017 which showed atypical ductal hyperplasia. She returns to discuss lumpectomy. Patient had previous lumpectomy in 2014 in the same breast for papilloma. Tamoxifen was felt to be too high risk to take and therefore Y style modification was recommended.                           - BENIGN BREAST PARENCHYMA SHOWING FOCAL FIBROCYSTIC CHANGES. - NO ATYPIA, HYPERPLASIA, OR MALIGNANCY IDENTIFIED. 2. Breast, right, needle core biopsy, calcs medial - ATYPICAL DUCTAL HYPERPLASIA WITH ASSOCIATED CALCIFICATIONS. - SEE COMMENT.                  CLINICAL DATA: 55 year old female presenting for evaluation of right breast calcifications detected on screening mammography. The patient has history of right breast biopsy in 2014 with definitive surgical excision demonstrating papilloma. She had a stereotactic biopsy for calcifications in December of 2016 with pathology indicating atypical ductal hyperplasia. Lumpectomy was recommended, however the patient has not proceeded with surgery to this point.  EXAM: DIGITAL DIAGNOSTIC RIGHT MAMMOGRAM WITH CAD  COMPARISON: Previous exam(s).  ACR Breast Density Category d: The breast  tissue is extremely dense, which lowers the sensitivity of mammography.  FINDINGS: In the medial aspect of the right breast extending from the middle to posterior depth in a segmental fashion, there is an increasing group of punctate and amorphous calcifications, some demonstrating a linear configuration. It is difficult to get the posterior most aspect of these calcifications, however they span at least 4 cm of tissue. An X shaped biopsy marking clip from the prior biopsy showing atypical ductal hyperplasia is seen along the anterior most margin of these calcifications.  Mammographic images were processed with CAD.  IMPRESSION: Increasing segmental group of calcifications in the medial aspect of the right breast, some with a suspicious linear distribution. This has been scheduled for 06/13/2016 at 1:45 p.m.  RECOMMENDATION: Stereotactic biopsy is recommended for a distant site of calcifications from the X shaped biopsy marking clip already present in the medial right breast denoting the site of ADH.  I have discussed the findings and recommendations with the patient. Results were also provided in writing at the conclusion of the visit. If applicable, a reminder letter will be sent to the patient regarding the next appointment.  BI-RADS CATEGORY 4: Suspicious.   Electronically Signed By: Ammie Ferrier M.D. On: 06/10/2016 16:52.  The patient is a 55 year old female.   Allergies Bary Castilla [place], [place]; 06/27/2016 10:14 AM) Sulfa 10 *OPHTHALMIC AGENTS*  Medication History Nance Pear, CMA; 06/27/2016 10:15 AM) PriLOSEC OTC (20MG  Tablet DR, Oral as needed) Active. No Current Medications (Taken starting 06/27/2016) Valtrex (500MG  Tablet, Oral as needed) Active. Medications Reconciled    Vitals (Sade [place]  CMA; 06/27/2016 10:15 AM) 06/27/2016 10:15 AM Weight: 170.4 lb Height: 67in Body Surface Area: 1.89 m Body Mass Index: 26.69 kg/m  Temp.:  98.54F  Pulse: 92 (Regular)  BP: 122/74 (Sitting, Left Arm, Standard)      Physical Exam (Iyla Balzarini A. Porfirio Bollier MD; 06/27/2016 11:49 AM)  General Mental Status-Alert. General Appearance-Consistent with stated age. Hydration-Well hydrated. Voice-Normal.  Head and Neck Head-normocephalic, atraumatic with no lesions or palpable masses. Trachea-midline. Thyroid Gland Characteristics - normal size and consistency.  Eye Eyeball - Bilateral-Extraocular movements intact. Sclera/Conjunctiva - Bilateral-No scleral icterus.  Chest and Lung Exam Chest and lung exam reveals -quiet, even and easy respiratory effort with no use of accessory muscles and on auscultation, normal breath sounds, no adventitious sounds and normal vocal resonance. Inspection Chest Wall - Normal. Back - normal.  Breast Note: Right breast shows postbiopsy changes. Right breast is slightly smaller than left. No masses. Small hematoma right breast core biopsy site. Scar noted at the [facility]  Cardiovascular Cardiovascular examination reveals -normal heart sounds, regular rate and rhythm with no murmurs and normal pedal pulses bilaterally.  Lymphatic Axillary  General Axillary Region: Bilateral - Description - Normal. Tenderness - Non Tender.    Assessment & Plan ([name] A. Derion Kreiter MD; 06/27/2016 11:49 AM)  ATYPICAL DUCTAL HYPERPLASIA OF RIGHT BREAST (N60.91) Impression: pt ready for lumpectomy needs to see plastics  Long discussion with patient and husband today. She does have volume loss on the right and I reviewed the mammograms with the patient and husband today. Lumpectomy can be done but she will lose more volume and therefore will be asymmetrical. I will refer to plastic surgery to discuss options. She would like to proceed with lumpectomy but repeat lumpectomy again will lead to cosmetic issues. Risk of lumpectomy include bleeding, infection, seroma, more surgery, use of seed/wire, wound  care, cosmetic deformity and the need for other treatments, death , blood clots, death. Pt agrees to proceed.  Current Plans Pt Education - [facility] Free Text Education/Instructions: discussed with patient and provided information. Pt Education - [facility] Breast Biopsy HCI: discussed with patient and provided information. The anatomy and the physiology was discussed. The pathophysiology and natural history of the disease was discussed. Options were discussed and recommendations were made. Technique, risks, benefits, & alternatives were discussed. Risks such as stroke, heart attack, bleeding, indection, death, and other risks discussed. Questions answered. The patient agrees to proceed. Cosmetic deformity, seroma infection pain and need for more surgery.

## 2016-07-07 DIAGNOSIS — N6091 Unspecified benign mammary dysplasia of right breast: Secondary | ICD-10-CM | POA: Diagnosis not present

## 2016-07-07 DIAGNOSIS — N6489 Other specified disorders of breast: Secondary | ICD-10-CM | POA: Diagnosis not present

## 2016-08-18 ENCOUNTER — Telehealth: Payer: Self-pay | Admitting: Hematology and Oncology

## 2016-08-18 ENCOUNTER — Telehealth: Payer: Self-pay

## 2016-08-18 NOTE — Telephone Encounter (Signed)
Patient called to cancel upcoming appointment with Dr Lindi Adie. Will call back to reschedule, when ready. 08/18/16

## 2016-08-18 NOTE — Telephone Encounter (Signed)
Received vm from pt to confirm what her appt on wed is for with Dr.Gudena. Called and lvm for pt to let her know that she is scheduled for a 1 yr follow up appt with Dr.Gudena on wed 1/24.call back number provided to reschedule if pt cannot make appt.

## 2016-08-20 ENCOUNTER — Ambulatory Visit: Payer: No Typology Code available for payment source | Admitting: Hematology and Oncology

## 2016-08-22 ENCOUNTER — Other Ambulatory Visit: Payer: Self-pay | Admitting: Surgery

## 2016-08-22 DIAGNOSIS — N6091 Unspecified benign mammary dysplasia of right breast: Secondary | ICD-10-CM

## 2016-09-03 NOTE — Pre-Procedure Instructions (Signed)
JEVAEH MUKAI  09/03/2016      [id number] - [place], [place] - [address] [place] [address] Phone: [phone] Fax: [phone]    Your procedure is scheduled on Thurs, Feb 15 @ 7:30 AM  Report to [facility] Admitting at 5:30 AM  Call this number if you have problems the morning of surgery:  [phone]   Remember:  Do not eat food or drink liquids after midnight.  Take these medicines the morning of surgery with A SIP OF WATER Eye Drops, Zyrtec(Cetirizine-if needed),and Valtrex(Valacyclovir-if needed)             Stop taking your Ibuprofen along with any Vitamins or Herbal Medications. No Goody's,BC's,Aleve,Advil,Motrin,or Fish Oil.    Do not wear jewelry, make-up or nail polish.  Do not wear lotions, powders, perfumes, or deoderant.  Do not shave 48 hours prior to surgery.    Do not bring valuables to the hospital.  [facility] is not responsible for any belongings or valuables.  Contacts, dentures or bridgework may not be worn into surgery.  Leave your suitcase in the car.  After surgery it may be brought to your room.  For patients admitted to the hospital, discharge time will be determined by your treatment team.  Patients discharged the day of surgery will not be allowed to drive home.    [facility]  Before surgery, you can play an important role.  Because skin is not sterile, your skin needs to be as free of germs as possible.  You can reduce the number of germs on you skin by washing with CHG (chlorahexidine gluconate) soap before surgery.  CHG is an antiseptic cleaner which kills germs and bonds with the skin to continue killing germs even after washing.  Please DO NOT use if you have an allergy to CHG or antibacterial soaps.  If your skin becomes reddened/irritated stop using the CHG and inform your nurse when you arrive at [facility]  Stay.  Do not shave (including legs and underarms) for at least 48 hours prior to the first CHG shower.  You may shave your face.  Please follow these instructions carefully:   1.  Shower with CHG Soap the night before surgery and the                                morning of Surgery.  2.  If you choose to wash your hair, wash your hair first as usual with your       normal shampoo.  3.  After you shampoo, rinse your hair and body thoroughly to remove the                      Shampoo.  4.  Use CHG as you would any other liquid soap.  You can apply chg directly       to the skin and wash gently with scrungie or a clean washcloth.  5.  Apply the CHG Soap to your body ONLY FROM THE NECK DOWN.        Do not use on open wounds or open sores.  Avoid contact with your eyes,       ears, mouth and genitals (private parts).  Wash genitals (private parts)       with your normal soap.  6.  Wash thoroughly, paying special attention to the area where your surgery        will be performed.  7.  Thoroughly rinse your body with warm water from the neck down.  8.  DO NOT shower/wash with your normal soap after using and rinsing off       the CHG Soap.  9.  Pat yourself dry with a clean towel.            10.  Wear clean pajamas.            11.  Place clean sheets on your bed the night of your first shower and do not        sleep with pets.  Day of Surgery  Do not apply any lotions/deoderants the morning of surgery.  Please wear clean clothes to the hospital/surgery center.    Please read over the following fact sheets that you were given. Pain Booklet, Coughing and Deep Breathing and Surgical Site Infection Prevention

## 2016-09-04 ENCOUNTER — Encounter (HOSPITAL_COMMUNITY)
Admission: RE | Admit: 2016-09-04 | Discharge: 2016-09-04 | Disposition: A | Discharge status: Home / Self Care | Payer: BLUE CROSS/BLUE SHIELD | Source: Ambulatory Visit | Attending: Surgery | Admitting: Surgery

## 2016-09-04 ENCOUNTER — Encounter (HOSPITAL_COMMUNITY): Payer: Self-pay

## 2016-09-04 DIAGNOSIS — Z01812 Encounter for preprocedural laboratory examination: Secondary | ICD-10-CM | POA: Diagnosis not present

## 2016-09-04 DIAGNOSIS — N6091 Unspecified benign mammary dysplasia of right breast: Secondary | ICD-10-CM

## 2016-09-04 DIAGNOSIS — C50911 Malignant neoplasm of unspecified site of right female breast: Secondary | ICD-10-CM | POA: Diagnosis not present

## 2016-09-04 LAB — CBC
HCT: 40.5 % (ref 36.0–46.0)
Hemoglobin: 12.8 g/dL (ref 12.0–15.0)
MCH: 25.7 pg — ABNORMAL LOW (ref 26.0–34.0)
MCHC: 31.6 g/dL (ref 30.0–36.0)
MCV: 81.2 fL (ref 78.0–100.0)
Platelets: 215 10*3/uL (ref 150–400)
RBC: 4.99 MIL/uL (ref 3.87–5.11)
RDW: 14.4 % (ref 11.5–15.5)
WBC: 4.7 10*3/uL (ref 4.0–10.5)

## 2016-09-04 NOTE — Progress Notes (Signed)
PCP: Dr. Janett Billow [place]

## 2016-09-09 ENCOUNTER — Other Ambulatory Visit: Payer: Self-pay | Admitting: Surgery

## 2016-09-09 ENCOUNTER — Ambulatory Visit
Admission: RE | Admit: 2016-09-09 | Discharge: 2016-09-09 | Disposition: A | Discharge status: Home / Self Care | Payer: BLUE CROSS/BLUE SHIELD | Source: Ambulatory Visit | Attending: Surgery | Admitting: Surgery

## 2016-09-09 DIAGNOSIS — N6091 Unspecified benign mammary dysplasia of right breast: Secondary | ICD-10-CM

## 2016-09-09 DIAGNOSIS — N6081 Other benign mammary dysplasias of right breast: Secondary | ICD-10-CM | POA: Diagnosis not present

## 2016-09-10 MED ORDER — DEXTROSE 5 % IV SOLN
3.0000 g | INTRAVENOUS | Status: AC
  Administered 2016-09-11: 2 g via INTRAVENOUS
  Filled 2016-09-10: qty 3000

## 2016-09-11 ENCOUNTER — Ambulatory Visit
Admit: 2016-09-11 | Discharge: 2016-09-11 | Disposition: A | Discharge status: Home / Self Care | Payer: BLUE CROSS/BLUE SHIELD | Attending: Surgery | Admitting: Surgery

## 2016-09-11 ENCOUNTER — Encounter (HOSPITAL_COMMUNITY)
Admission: RE | Disposition: A | Discharge status: Home / Self Care | Payer: Self-pay | Source: Ambulatory Visit | Attending: Surgery

## 2016-09-11 ENCOUNTER — Ambulatory Visit (HOSPITAL_COMMUNITY)
Admission: RE | Admit: 2016-09-11 | Discharge: 2016-09-11 | Disposition: A | Discharge status: Home / Self Care | Payer: BLUE CROSS/BLUE SHIELD | Source: Ambulatory Visit | Attending: Surgery | Admitting: Surgery

## 2016-09-11 ENCOUNTER — Ambulatory Visit (HOSPITAL_COMMUNITY): Payer: BLUE CROSS/BLUE SHIELD | Admitting: Certified Registered Nurse Anesthetist

## 2016-09-11 ENCOUNTER — Ambulatory Visit
Admission: RE | Admit: 2016-09-11 | Discharge: 2016-09-11 | Disposition: A | Discharge status: Home / Self Care | Payer: BLUE CROSS/BLUE SHIELD | Source: Ambulatory Visit | Attending: Surgery | Admitting: Surgery

## 2016-09-11 ENCOUNTER — Encounter (HOSPITAL_COMMUNITY): Payer: Self-pay | Admitting: Certified Registered Nurse Anesthetist

## 2016-09-11 DIAGNOSIS — Z882 Allergy status to sulfonamides status: Secondary | ICD-10-CM | POA: Insufficient documentation

## 2016-09-11 DIAGNOSIS — G4700 Insomnia, unspecified: Secondary | ICD-10-CM | POA: Diagnosis not present

## 2016-09-11 DIAGNOSIS — N6091 Unspecified benign mammary dysplasia of right breast: Secondary | ICD-10-CM | POA: Insufficient documentation

## 2016-09-11 DIAGNOSIS — N6081 Other benign mammary dysplasias of right breast: Secondary | ICD-10-CM | POA: Diagnosis not present

## 2016-09-11 DIAGNOSIS — N6011 Diffuse cystic mastopathy of right breast: Secondary | ICD-10-CM | POA: Insufficient documentation

## 2016-09-11 DIAGNOSIS — K219 Gastro-esophageal reflux disease without esophagitis: Secondary | ICD-10-CM | POA: Insufficient documentation

## 2016-09-11 DIAGNOSIS — R921 Mammographic calcification found on diagnostic imaging of breast: Secondary | ICD-10-CM | POA: Diagnosis not present

## 2016-09-11 DIAGNOSIS — R928 Other abnormal and inconclusive findings on diagnostic imaging of breast: Secondary | ICD-10-CM | POA: Diagnosis not present

## 2016-09-11 HISTORY — PX: BREAST LUMPECTOMY WITH RADIOACTIVE SEED LOCALIZATION: SHX6424

## 2016-09-11 SURGERY — BREAST LUMPECTOMY WITH RADIOACTIVE SEED LOCALIZATION
Anesthesia: General | Site: Breast | Laterality: Right

## 2016-09-11 MED ORDER — HYDROMORPHONE HCL 1 MG/ML IJ SOLN
0.2500 mg | INTRAMUSCULAR | Status: DC | PRN
  Administered 2016-09-11 (×2): 0.5 mg via INTRAVENOUS

## 2016-09-11 MED ORDER — MIDAZOLAM HCL 2 MG/2ML IJ SOLN
INTRAMUSCULAR | Status: AC
  Filled 2016-09-11: qty 2

## 2016-09-11 MED ORDER — LIDOCAINE 2% (20 MG/ML) 5 ML SYRINGE
INTRAMUSCULAR | Status: AC
  Filled 2016-09-11: qty 5

## 2016-09-11 MED ORDER — SUCCINYLCHOLINE CHLORIDE 200 MG/10ML IV SOSY
PREFILLED_SYRINGE | INTRAVENOUS | Status: AC
  Filled 2016-09-11: qty 30

## 2016-09-11 MED ORDER — FENTANYL CITRATE (PF) 100 MCG/2ML IJ SOLN
INTRAMUSCULAR | Status: DC | PRN
  Administered 2016-09-11 (×2): 25 ug via INTRAVENOUS

## 2016-09-11 MED ORDER — PHENYLEPHRINE 40 MCG/ML (10ML) SYRINGE FOR IV PUSH (FOR BLOOD PRESSURE SUPPORT)
PREFILLED_SYRINGE | INTRAVENOUS | Status: AC
  Filled 2016-09-11: qty 10

## 2016-09-11 MED ORDER — FENTANYL CITRATE (PF) 100 MCG/2ML IJ SOLN
INTRAMUSCULAR | Status: AC
  Filled 2016-09-11: qty 2

## 2016-09-11 MED ORDER — BUPIVACAINE HCL 0.25 % IJ SOLN
INTRAMUSCULAR | Status: DC | PRN
  Administered 2016-09-11: 30 mL

## 2016-09-11 MED ORDER — PHENYLEPHRINE HCL 10 MG/ML IJ SOLN
INTRAMUSCULAR | Status: DC | PRN
  Administered 2016-09-11: 40 ug via INTRAVENOUS
  Administered 2016-09-11: 80 ug via INTRAVENOUS
  Administered 2016-09-11: 40 ug via INTRAVENOUS

## 2016-09-11 MED ORDER — DEXAMETHASONE SODIUM PHOSPHATE 10 MG/ML IJ SOLN
INTRAMUSCULAR | Status: AC
  Filled 2016-09-11: qty 1

## 2016-09-11 MED ORDER — LACTATED RINGERS IV SOLN
INTRAVENOUS | Status: DC | PRN
  Administered 2016-09-11: 07:00:00 via INTRAVENOUS

## 2016-09-11 MED ORDER — BUPIVACAINE HCL (PF) 0.25 % IJ SOLN
INTRAMUSCULAR | Status: AC
  Filled 2016-09-11: qty 30

## 2016-09-11 MED ORDER — HYDROMORPHONE HCL 1 MG/ML IJ SOLN
INTRAMUSCULAR | Status: AC
  Filled 2016-09-11: qty 0.5

## 2016-09-11 MED ORDER — ONDANSETRON HCL 4 MG/2ML IJ SOLN
INTRAMUSCULAR | Status: AC
  Filled 2016-09-11: qty 2

## 2016-09-11 MED ORDER — PROPOFOL 10 MG/ML IV BOLUS
INTRAVENOUS | Status: DC | PRN
  Administered 2016-09-11: 180 mg via INTRAVENOUS

## 2016-09-11 MED ORDER — HYDROMORPHONE HCL 1 MG/ML IJ SOLN: 0.2500 mg | INTRAMUSCULAR | Status: DC | PRN

## 2016-09-11 MED ORDER — PHENYLEPHRINE HCL 10 MG/ML IJ SOLN
INTRAVENOUS | Status: DC | PRN
  Administered 2016-09-11: 25 ug/min via INTRAVENOUS

## 2016-09-11 MED ORDER — PROPOFOL 10 MG/ML IV BOLUS
INTRAVENOUS | Status: AC
  Filled 2016-09-11: qty 20

## 2016-09-11 MED ORDER — CHLORHEXIDINE GLUCONATE CLOTH 2 % EX PADS: 6.0000 | MEDICATED_PAD | Freq: Once | CUTANEOUS | Status: DC

## 2016-09-11 MED ORDER — EPHEDRINE 5 MG/ML INJ
INTRAVENOUS | Status: AC
  Filled 2016-09-11: qty 10

## 2016-09-11 MED ORDER — ROCURONIUM BROMIDE 50 MG/5ML IV SOSY
PREFILLED_SYRINGE | INTRAVENOUS | Status: AC
  Filled 2016-09-11: qty 15

## 2016-09-11 MED ORDER — ONDANSETRON HCL 4 MG/2ML IJ SOLN
INTRAMUSCULAR | Status: DC | PRN
  Administered 2016-09-11: 4 mg via INTRAVENOUS

## 2016-09-11 MED ORDER — LIDOCAINE HCL (CARDIAC) 20 MG/ML IV SOLN
INTRAVENOUS | Status: DC | PRN
  Administered 2016-09-11: 60 mg via INTRATRACHEAL

## 2016-09-11 MED ORDER — LIDOCAINE 2% (20 MG/ML) 5 ML SYRINGE
INTRAMUSCULAR | Status: AC
  Filled 2016-09-11: qty 15

## 2016-09-11 MED ORDER — 0.9 % SODIUM CHLORIDE (POUR BTL) OPTIME
TOPICAL | Status: DC | PRN
  Administered 2016-09-11: 1000 mL

## 2016-09-11 MED ORDER — HYDROCODONE-ACETAMINOPHEN 5-325 MG PO TABS: 1.0000 | ORAL_TABLET | Freq: Four times a day (QID) | ORAL | 0 refills | Status: DC | PRN

## 2016-09-11 MED ORDER — DEXAMETHASONE SODIUM PHOSPHATE 10 MG/ML IJ SOLN
INTRAMUSCULAR | Status: DC | PRN
  Administered 2016-09-11: 4 mg via INTRAVENOUS

## 2016-09-11 MED ORDER — MIDAZOLAM HCL 2 MG/2ML IJ SOLN
INTRAMUSCULAR | Status: DC | PRN
Start: 2016-09-11 — End: 2016-09-11
  Administered 2016-09-11: 1 mg via INTRAVENOUS

## 2016-09-11 MED ORDER — BUPIVACAINE-EPINEPHRINE (PF) 0.5% -1:200000 IJ SOLN
INTRAMUSCULAR | Status: AC
  Filled 2016-09-11: qty 30

## 2016-09-11 SURGICAL SUPPLY — 50 items
ADH SKN CLS APL DERMABOND .7 (GAUZE/BANDAGES/DRESSINGS) ×1
APPLIER CLIP 9.375 MED OPEN (MISCELLANEOUS) ×2
BINDER BREAST LRG (GAUZE/BANDAGES/DRESSINGS) ×2 IMPLANT
BINDER BREAST XLRG (GAUZE/BANDAGES/DRESSINGS) IMPLANT
BLADE SURG 15 STRL LF DISP TIS (BLADE) ×1 IMPLANT
BLADE SURG 15 STRL SS (BLADE) ×1
CANISTER SUCTION 2500CC (MISCELLANEOUS) IMPLANT
CHLORAPREP W/TINT 26ML (MISCELLANEOUS) ×2 IMPLANT
CLIP APPLIE 9.375 MED OPEN (MISCELLANEOUS) ×1 IMPLANT
CONT SPEC 4OZ CLIKSEAL STRL BL (MISCELLANEOUS) ×4 IMPLANT
COVER LIGHT HANDLE STERIS (MISCELLANEOUS) ×2 IMPLANT
COVER PROBE W GEL 5X96 (DRAPES) ×2 IMPLANT
DERMABOND ADVANCED (GAUZE/BANDAGES/DRESSINGS) ×1
DERMABOND ADVANCED .7 DNX12 (GAUZE/BANDAGES/DRESSINGS) ×1 IMPLANT
DEVICE DUBIN SPECIMEN MAMMOGRA (MISCELLANEOUS) ×2 IMPLANT
DRAPE CHEST BREAST 15X10 FENES (DRAPES) ×2 IMPLANT
DRAPE UTILITY XL STRL (DRAPES) ×2 IMPLANT
ELECT CAUTERY BLADE 6.4 (BLADE) ×2 IMPLANT
ELECT REM PT RETURN 9FT ADLT (ELECTROSURGICAL) ×2
ELECTRODE REM PT RTRN 9FT ADLT (ELECTROSURGICAL) ×1 IMPLANT
GLOVE BIO SURGEON STRL SZ8 (GLOVE) ×2 IMPLANT
GLOVE BIOGEL PI IND STRL 6.5 (GLOVE) ×1 IMPLANT
GLOVE BIOGEL PI IND STRL 8 (GLOVE) ×1 IMPLANT
GLOVE BIOGEL PI INDICATOR 6.5 (GLOVE) ×1
GLOVE BIOGEL PI INDICATOR 8 (GLOVE) ×1
GLOVE SURG SS PI 6.5 STRL IVOR (GLOVE) ×2 IMPLANT
GOWN STRL REUS W/ TWL LRG LVL3 (GOWN DISPOSABLE) ×2 IMPLANT
GOWN STRL REUS W/ TWL XL LVL3 (GOWN DISPOSABLE) ×1 IMPLANT
GOWN STRL REUS W/TWL LRG LVL3 (GOWN DISPOSABLE) ×4
GOWN STRL REUS W/TWL XL LVL3 (GOWN DISPOSABLE) ×2
KIT BASIN OR (CUSTOM PROCEDURE TRAY) ×2 IMPLANT
KIT MARKER MARGIN INK (KITS) ×2 IMPLANT
LIGHT WAVEGUIDE WIDE FLAT (MISCELLANEOUS) IMPLANT
NEEDLE HYPO 25X1 1.5 SAFETY (NEEDLE) ×2 IMPLANT
NS IRRIG 1000ML POUR BTL (IV SOLUTION) ×2 IMPLANT
PACK SURGICAL SETUP 50X90 (CUSTOM PROCEDURE TRAY) ×2 IMPLANT
PENCIL BUTTON HOLSTER BLD 10FT (ELECTRODE) ×2 IMPLANT
SPONGE LAP 18X18 X RAY DECT (DISPOSABLE) ×2 IMPLANT
SUT MNCRL AB 4-0 PS2 18 (SUTURE) ×2 IMPLANT
SUT SILK 2 0 SH (SUTURE) IMPLANT
SUT VIC AB 2-0 SH 27 (SUTURE) ×2
SUT VIC AB 2-0 SH 27XBRD (SUTURE) ×1 IMPLANT
SUT VIC AB 3-0 SH 27 (SUTURE) ×2
SUT VIC AB 3-0 SH 27X BRD (SUTURE) ×1 IMPLANT
SYR BULB 3OZ (MISCELLANEOUS) ×2 IMPLANT
SYR CONTROL 10ML LL (SYRINGE) ×2 IMPLANT
TOWEL OR 17X24 6PK STRL BLUE (TOWEL DISPOSABLE) ×2 IMPLANT
TOWEL OR 17X26 10 PK STRL BLUE (TOWEL DISPOSABLE) IMPLANT
TUBE CONNECTING 12X1/4 (SUCTIONS) ×2 IMPLANT
YANKAUER SUCT BULB TIP NO VENT (SUCTIONS) ×2 IMPLANT

## 2016-09-11 NOTE — Interval H&P Note (Signed)
History and Physical Interval Note:  09/11/2016 7:15 AM  Sheryl Bryan  has presented today for surgery, with the diagnosis of ATYPICAL DUCTAL HYPERPLASIA  The various methods of treatment have been discussed with the patient and family. After consideration of risks, benefits and other options for treatment, the patient has consented to  Procedure(s): RIGHT BREAST LUMPECTOMY WITH RADIOACTIVE SEED LOCALIZATION (Right) as a surgical intervention .  The patient's history has been reviewed, patient examined, no change in status, stable for surgery.  I have reviewed the patient's chart and labs.  Questions were answered to the patient's satisfaction.     Jaymison Luber A.

## 2016-09-11 NOTE — Anesthesia Preprocedure Evaluation (Addendum)
Anesthesia Evaluation  Patient identified by MRN, date of birth, ID band Patient awake    Reviewed: Allergy & Precautions, NPO status , Patient's Chart, lab work & pertinent test results  Airway Mallampati: II  TM Distance: >3 FB Neck ROM: Full    Dental  (+) Teeth Intact, Dental Advisory Given   Pulmonary neg pulmonary ROS,    breath sounds clear to auscultation       Cardiovascular negative cardio ROS   Rhythm:Regular Rate:Normal     Neuro/Psych    GI/Hepatic Neg liver ROS, GERD  ,  Endo/Other  negative endocrine ROS  Renal/GU negative Renal ROS     Musculoskeletal   Abdominal   Peds  Hematology   Anesthesia Other Findings   Reproductive/Obstetrics                            Anesthesia Physical Anesthesia Plan  ASA: III  Anesthesia Plan: General   Post-op Pain Management:    Induction: Intravenous  Airway Management Planned: Oral ETT  Additional Equipment:   Intra-op Plan:   Post-operative Plan: Extubation in OR  Informed Consent: I have reviewed the patients History and Physical, chart, labs and discussed the procedure including the risks, benefits and alternatives for the proposed anesthesia with the patient or authorized representative who has indicated his/her understanding and acceptance.   Dental advisory given  Plan Discussed with: CRNA and Anesthesiologist  Anesthesia Plan Comments:         Anesthesia Quick Evaluation

## 2016-09-11 NOTE — H&P (Signed)
Pre-op/Pre-procedure Orders Open     [facility]  Erroll Luna, MD  General Surgery   Atypical ductal hyperplasia of right breast  Dx   Additional Documentation   Encounter Info:   Billing Info,   History,   Allergies,   Detailed Report     All Notes   H&P by Erroll Luna, MD at 11:46 AM   Author: Erroll Luna, MD Author Type: Physician Filed: [id number] 11:50 AM  Note Status: Signed Cosign: Cosign Not Required Encounter Date:  1 AM  Editor: Erroll Luna, MD (Physician)    Gardiner Rhyme. Freda Munro  Location: [facility] Patient #: [id number] DOB: July 24, 1961 Married / Language: English / Race: Black or African American Female  History of Present Illness Marcello Moores A. Drue Camera MD; 11:48 AM) Patient words: Patient returns for follow-up of her atypical ductal hyperplasia diagnosed in 2016 by core biopsy and mammogram. She was sent to the high risk clinic and seen in felt to have a mild increase in her risk of breast cancer from her evaluation. I offered a lumpectomy last year but she decided to wait. She returned for a six-month follow-up mammogram the calcifications had gone from 4.6 cm to 4 cm. There were a few areas that were more pleomorphic and core biopsy was done in November 2017 which showed atypical ductal hyperplasia. She returns to discuss lumpectomy. Patient had previous lumpectomy in 2014 in the same breast for papilloma. Tamoxifen was felt to be too high risk to take and therefore Y style modification was recommended.                           - BENIGN BREAST PARENCHYMA SHOWING FOCAL FIBROCYSTIC CHANGES. - NO ATYPIA, HYPERPLASIA, OR MALIGNANCY IDENTIFIED. 2. Breast, right, needle core biopsy, calcs medial - ATYPICAL DUCTAL HYPERPLASIA WITH ASSOCIATED CALCIFICATIONS. - SEE COMMENT.                  CLINICAL DATA: 56 year old female presenting for evaluation of right  breast calcifications detected on screening mammography. The patient has history of right breast biopsy in 2014 with definitive surgical excision demonstrating papilloma. She had a stereotactic biopsy for calcifications in December of 2016 with pathology indicating atypical ductal hyperplasia. Lumpectomy was recommended, however the patient has not proceeded with surgery to this point.  EXAM: DIGITAL DIAGNOSTIC RIGHT MAMMOGRAM WITH CAD  COMPARISON: Previous exam(s).  ACR Breast Density Category d: The breast tissue is extremely dense, which lowers the sensitivity of mammography.  FINDINGS: In the medial aspect of the right breast extending from the middle to posterior depth in a segmental fashion, there is an increasing group of punctate and amorphous calcifications, some demonstrating a linear configuration. It is difficult to get the posterior most aspect of these calcifications, however they span at least 4 cm of tissue. An X shaped biopsy marking clip from the prior biopsy showing atypical ductal hyperplasia is seen along the anterior most margin of these calcifications.  Mammographic images were processed with CAD.  IMPRESSION: Increasing segmental group of calcifications in the medial aspect of the right breast, some with a suspicious linear distribution. This has been scheduled for 06/13/2016 at 1:45 p.m.  RECOMMENDATION: Stereotactic biopsy is recommended for a distant site of calcifications from the X shaped biopsy marking clip already present in the medial right breast denoting the site of ADH.  I have discussed the findings and recommendations with the patient. Results were also provided in writing at the  conclusion of the visit. If applicable, a reminder letter will be sent to the patient regarding the next appointment.  BI-RADS CATEGORY 4: Suspicious.   Electronically Signed By: Ammie Ferrier M.D.   The patient is a 56 year old  female.   Allergies Bary Castilla [place], [place]; 06/27/2016 10:14 AM) Sulfa 10 *OPHTHALMIC AGENTS*  Medication History Nance Pear, CMA; 06/27/2016 10:15 AM) PriLOSEC OTC (20MG  Tablet DR, Oral as needed) Active. No Current Medications (Taken starting 06/27/2016) Valtrex (500MG  Tablet, Oral as needed) Active. Medications Reconciled    Vitals Nance Pear CMA; 1 06/27/2016 10:15 AM Weight: 170.4 lb Height: 67in Body Surface Area: 1.89 m Body Mass Index: 26.69 kg/m  Temp.: 98.66F  Pulse: 92 (Regular)  BP: 122/74 (Sitting, Left Arm, Standard)      Physical Exam (Cristin Penaflor A. Lamount Bankson MD; )  General Mental Status-Alert. General Appearance-Consistent with stated age. Hydration-Well hydrated. Voice-Normal.  Head and Neck Head-normocephalic, atraumatic with no lesions or palpable masses. Trachea-midline. Thyroid Gland Characteristics - normal size and consistency.  Eye Eyeball - Bilateral-Extraocular movements intact. Sclera/Conjunctiva - Bilateral-No scleral icterus.  Chest and Lung Exam Chest and lung exam reveals -quiet, even and easy respiratory effort with no use of accessory muscles and on auscultation, normal breath sounds, no adventitious sounds and normal vocal resonance. Inspection Chest Wall - Normal. Back - normal.  Breast Note: Right breast shows postbiopsy changes. Right breast is slightly smaller than left. No masses. Small hematoma right breast core biopsy site. Scar noted at the [facility]  Cardiovascular Cardiovascular examination reveals -normal heart sounds, regular rate and rhythm with no murmurs and normal pedal pulses bilaterally.  Lymphatic Axillary  General Axillary Region: Bilateral - Description - Normal. Tenderness - Non Tender.    Assessment & Plan (Bashir Marchetti A. Meera Vasco MD;   ATYPICAL DUCTAL HYPERPLASIA OF RIGHT BREAST (N60.91) Impression: pt ready for lumpectomy needs to see  plastics  Long discussion with patient and husband today. She does have volume loss on the right and I reviewed the mammograms with the patient and husband today. Lumpectomy can be done but she will lose more volume and therefore will be asymmetrical. I will refer to plastic surgery to discuss options. She would like to proceed with lumpectomy but repeat lumpectomy again will lead to cosmetic issues. Risk of lumpectomy include bleeding, infection, seroma, more surgery, use of seed/wire, wound care, cosmetic deformity and the need for other treatments, death , blood clots, death. Pt agrees to proceed.  Current Plans Pt Education - [facility] Free Text Education/Instructions: discussed with patient and provided information. Pt Education - [facility] Breast Biopsy HCI: discussed with patient and provided information. The anatomy and the physiology was discussed. The pathophysiology and natural history of the disease was discussed. Options were discussed and recommendations were made. Technique, risks, benefits, & alternatives were discussed. Risks such as stroke, heart attack, bleeding, indection, death, and other risks discussed. Questions answered. The patient agrees to proceed. Cosmetic deformity, seroma infection pain and need for more surgery.

## 2016-09-11 NOTE — Op Note (Signed)
Preoperative diagnosis: Atypical ductal hyperplasia right breast 2  Postoperative diagnosis: Same  Procedure: Right breast seed localized lumpectomy 2  Surgeon: Thomas Cornett M.D.  Anesthesia: LMA with 0.25% Sensorcaine plain  EBL minimal  Specimens: Right central and right medial breast lesions to pathology. X-ray was used to verify that both clips and both see were removed.   Drains: None  Indications for procedure: The patient is a 55-year-old female with a history of atypical ductal hyperplasia of the right breast. She has had a previous lumpectomy in the past for this. Imaging is revealed [place] areas that were biopsied and found to be consistent with that. Discussions of repeat lumpectomy versus mastectomy and reconstruction given her recurrence and the fact that these do potentially contribute to developing breast cancer and increased risk. She has been seen by plastic surgery. She opted for 2 lumpectomies. We discussed potential volume loss and cosmetic issues.The procedure has been discussed with the patient. Alternatives to surgery have been discussed with the patient.  Risks of surgery include bleeding,  Infection,  Seroma formation, death,  and the need for further surgery.   The patient understands and wishes to proceed.     Description of procedure: The patient was met in the holding area and questions are answered. Neoprobe was used and procedure were active. Right breast was marked as the correct side. She was taken back to the operating room and placed supine. After induction of LMA anesthesia, the right breast was prepped and draped in a sterile fashion. Timeout was done. Neoprobe was used in both his cc were localized and marked with a pen on the skin. I used a previous scar at the edge of the nipple areolar complex medially. I was able to excise the medial lesion through this with cautery. I then was able to dissect over and excise the more medial lesion in the right  breast. In the process the clip was dislodged. We placed the clip in a separate container. The remainder the tissue where the clip is located was excised. Specimen radiograph of both lesions showed both clips were removed and the procedure removed. Additional tissue was taken from the medial margin of the medial lesion. Hemostasis was achieved. The wound was then closed with 3-0 Vicryl and 4-0 Monocryl. Dermabond applied. Breast binder placed. All final counts are found to be correct. Patient was awoke and extubated taken to recovery in satisfactory condition. 

## 2016-09-11 NOTE — Anesthesia Procedure Notes (Signed)
Procedure Name: LMA Insertion Date/Time: 09/11/2016 7:42 AM Performed by: Julieta Bellini Pre-anesthesia Checklist: Patient identified, Emergency Drugs available, Suction available, Patient being monitored and Timeout performed Patient Re-evaluated:Patient Re-evaluated prior to inductionOxygen Delivery Method: Circle system utilized Preoxygenation: Pre-oxygenation with 100% oxygen Intubation Type: IV induction Ventilation: Mask ventilation without difficulty LMA: LMA inserted LMA Size: 4.0 Number of attempts: 1 Placement Confirmation: breath sounds checked- equal and bilateral and positive ETCO2 Dental Injury: Teeth and Oropharynx as per pre-operative assessment

## 2016-09-11 NOTE — Discharge Instructions (Signed)
[facility] °Office Phone Number [phone] ° °BREAST BIOPSY/ PARTIAL MASTECTOMY: POST OP INSTRUCTIONS ° °Always review your discharge instruction sheet given to you by the facility where your surgery was performed. ° °IF YOU HAVE DISABILITY OR FAMILY LEAVE FORMS, YOU MUST BRING THEM TO THE OFFICE FOR PROCESSING.  DO NOT GIVE THEM TO YOUR DOCTOR. ° °1. A prescription for pain medication may be given to you upon discharge.  Take your pain medication as prescribed, if needed.  If narcotic pain medicine is not needed, then you may take acetaminophen (Tylenol) or ibuprofen (Advil) as needed. °2. Take your usually prescribed medications unless otherwise directed °3. If you need a refill on your pain medication, please contact your pharmacy.  They will contact our office to request authorization.  Prescriptions will not be filled after 5pm or on week-ends. °4. You should eat very light the first 24 hours after surgery, such as soup, crackers, pudding, etc.  Resume your normal diet the day after surgery. °5. Most patients will experience some swelling and bruising in the breast.  Ice packs and a good support bra will help.  Swelling and bruising can take several days to resolve.  °6. It is common to experience some constipation if taking pain medication after surgery.  Increasing fluid intake and taking a stool softener will usually help or prevent this problem from occurring.  A mild laxative (Milk of Magnesia or Miralax) should be taken according to package directions if there are no bowel movements after 48 hours. °7. Unless discharge instructions indicate otherwise, you may remove your bandages 24-48 hours after surgery, and you may shower at that time.  You may have steri-strips (small skin tapes) in place directly over the incision.  These strips should be left on the skin for 7-10 days.  If your surgeon used skin glue on the incision, you may shower in 24 hours.  The glue will flake off over the  next 2-3 weeks.  Any sutures or staples will be removed at the office during your follow-up visit. °8. ACTIVITIES:  You may resume regular daily activities (gradually increasing) beginning the next day.  Wearing a good support bra or sports bra minimizes pain and swelling.  You may have sexual intercourse when it is comfortable. °a. You may drive when you no longer are taking prescription pain medication, you can comfortably wear a seatbelt, and you can safely maneuver your car and apply brakes. °b. RETURN TO WORK:  ______________________________________________________________________________________ °9. You should see your doctor in the office for a follow-up appointment approximately two weeks after your surgery.  Your doctor’s nurse will typically make your follow-up appointment when she calls you with your pathology report.  Expect your pathology report 2-3 business days after your surgery.  You may call to check if you do not hear from us after three days. °10. OTHER INSTRUCTIONS: _______________________________________________________________________________________________ _____________________________________________________________________________________________________________________________________ °_____________________________________________________________________________________________________________________________________ °_____________________________________________________________________________________________________________________________________ ° °WHEN TO CALL YOUR DOCTOR: °1. Fever over 101.0 °2. Nausea and/or vomiting. °3. Extreme swelling or bruising. °4. Continued bleeding from incision. °5. Increased pain, redness, or drainage from the incision. ° °The clinic staff is available to answer your questions during regular business hours.  Please don’t hesitate to call and ask to speak to one of the nurses for clinical concerns.  If you have a medical emergency, go to the nearest  emergency room or call 911.  A surgeon from [facility] is always on call at the hospital. ° °For further questions, please visit [email or web address]  °

## 2016-09-11 NOTE — Transfer of Care (Signed)
Immediate Anesthesia Transfer of Care Note  Patient: Sheryl Bryan  Procedure(s) Performed: Procedure(s): RIGHT BREAST LUMPECTOMY WITH RADIOACTIVE SEED LOCALIZATION (Right)  Patient Location: PACU  Anesthesia Type:General  Level of Consciousness: awake, alert  and patient cooperative  Airway & Oxygen Therapy: Patient Spontanous Breathing  Post-op Assessment: Report given to RN, Post -op Vital signs reviewed and stable and Patient moving all extremities X 4  Post vital signs: Reviewed and stable  Last Vitals:  Vitals:   09/11/16 0551 09/11/16 0851  BP: (!) 151/80 (!) 155/96  Pulse: 76 89  Resp: 20 17  Temp: 36.9 C 36.6 C    Last Pain: There were no vitals filed for this visit.    Patients Stated Pain Goal: 8 ([id number])  Complications: No apparent anesthesia complications

## 2016-09-11 NOTE — Anesthesia Postprocedure Evaluation (Signed)
Anesthesia Post Note  Patient: Sheryl Bryan  Procedure(s) Performed: Procedure(s) (LRB): RIGHT BREAST LUMPECTOMY WITH RADIOACTIVE SEED LOCALIZATION (Right)  Patient location during evaluation: PACU Anesthesia Type: General Level of consciousness: awake Pain management: pain level controlled Respiratory status: spontaneous breathing Cardiovascular status: stable Anesthetic complications: no       Last Vitals:  Vitals:   09/11/16 1005 09/11/16 1013  BP: 109/67 111/73  Pulse: 72 71  Resp: 10 18  Temp:  36.4 C    Last Pain:  Vitals:   09/11/16 0950  PainSc: Asleep                 Raevon Broom

## 2016-09-12 ENCOUNTER — Encounter (HOSPITAL_COMMUNITY): Payer: Self-pay | Admitting: Surgery

## 2017-04-15 ENCOUNTER — Encounter: Payer: Self-pay | Admitting: Medical

## 2017-04-15 ENCOUNTER — Telehealth: Payer: Self-pay | Admitting: Medical

## 2017-04-15 ENCOUNTER — Ambulatory Visit (INDEPENDENT_AMBULATORY_CARE_PROVIDER_SITE_OTHER): Payer: BLUE CROSS/BLUE SHIELD | Admitting: Medical

## 2017-04-15 VITALS — BP 118/68 | HR 83 | Temp 98.2°F | Ht 67.0 in | Wt 165.2 lb

## 2017-04-15 DIAGNOSIS — J40 Bronchitis, not specified as acute or chronic: Secondary | ICD-10-CM

## 2017-04-15 DIAGNOSIS — R05 Cough: Secondary | ICD-10-CM | POA: Diagnosis not present

## 2017-04-15 DIAGNOSIS — J029 Acute pharyngitis, unspecified: Secondary | ICD-10-CM

## 2017-04-15 DIAGNOSIS — R059 Cough, unspecified: Secondary | ICD-10-CM

## 2017-04-15 MED ORDER — BENZONATATE 100 MG PO CAPS
100.0000 mg | ORAL_CAPSULE | Freq: Three times a day (TID) | ORAL | 0 refills | Status: DC | PRN
Start: 2017-04-15 — End: 2017-05-20

## 2017-04-15 MED ORDER — FLUTICASONE PROPIONATE 50 MCG/ACT NA SUSP: 2.0000 | Freq: Every day | NASAL | 1 refills | Status: DC

## 2017-04-15 MED ORDER — AZITHROMYCIN 250 MG PO TABS: ORAL_TABLET | ORAL | 0 refills | Status: DC

## 2017-04-15 NOTE — Patient Instructions (Addendum)
You appear to have bronchitis. Rest hydrate and tylenol for fever. I am prescribing  benzonatate cough medicine, and azithromycin antibiotic. For your nasal congestion Flonase.  You should gradually get better. If not then notify us and would recommend a chest xray.  You do also have recent moderate sore throat and your throat has moderate redness on exam. Your rapid strep was negative but will send out throat culture. Azithromycin does have good coverage for most cases of strep throat. If the  send out test is positive and you still have lingering sore throat then would prescribe additional antibiotic.  Your flu test was negative today.  Follow up in 7-10 days or as needed

## 2017-04-15 NOTE — Telephone Encounter (Signed)
  Can you reorder patient's rapid strep and her rapid flu test. Looks like neither was resulted. I could not close chart without rapid strep being resulted so I did that order out. But does need to be a resulted so please do that. Let me know if you having any problems.

## 2017-04-15 NOTE — Progress Notes (Signed)
Subjective:    Patient ID: Sheryl Bryan, female    DOB: 1960/10/27, 56 y.o.   MRN: [id number]  HPI  Pt in for recent st since past st. She had been traveling a lot. Then on Sunday had some chest congestion followed by cough. Last night some fever 100.6. Pt feels like she needs to bring up mucous but can't.  She has hoarse voice. With some sore throat.  Pt clarifies a lot of air travel. No leg pain reported. No popliteal pain.  Cough sometimes wakes her up from sleep at times but described being able to sleep.. No wheezing.   Pt daughter visited and briefly around her 2 weeks. Daughter had some recent tonsillitis.   Review of Systems  Constitutional: Positive for fatigue and fever. Negative for chills.  HENT: Positive for congestion, postnasal drip, sneezing and sore throat. Negative for ear pain, nosebleeds, sinus pain, sinus pressure and tinnitus.   Respiratory: Positive for cough. Negative for chest tightness, shortness of breath and wheezing.   Cardiovascular: Negative for chest pain and palpitations.  Gastrointestinal: Negative for abdominal distention, abdominal pain, blood in stool and constipation.  Musculoskeletal: Positive for myalgias.       Mid and lower back ache.  Neurological: Negative for dizziness, syncope, weakness, light-headedness, numbness and headaches.  Hematological: Negative for adenopathy. Does not bruise/bleed easily.  Psychiatric/Behavioral: Negative for behavioral problems and confusion.   Past Medical History:  Diagnosis Date  . GERD (gastroesophageal reflux disease)    occ  . Papilloma of breast   . UTI (urinary tract infection)      Social History   Social History  . Marital status: Married    Spouse name: N/A  . Number of children: N/A  . Years of education: N/A   Occupational History  . Not on file.   Social History Main Topics  . Smoking status: Never Smoker  . Smokeless tobacco: Never Used  . Alcohol use Yes     Comment:  occasional  . Drug use: No  . Sexual activity: Not on file   Other Topics Concern  . Not on file   Social History Narrative  . No narrative on file    Past Surgical History:  Procedure Laterality Date  . BREAST LUMPECTOMY WITH RADIOACTIVE SEED LOCALIZATION Right 09/11/2016   Procedure: RIGHT BREAST LUMPECTOMY WITH RADIOACTIVE SEED LOCALIZATION;  Surgeon: Erroll Luna, MD;  Location: [facility];  Service: General;  Laterality: Right;  . CESAREAN SECTION  12/04/93, 01/05/79  . ENDOMETRIAL ABLATION  2008  . FRACTURE SURGERY     rt pinkie toe  . PARTIAL MASTECTOMY WITH NEEDLE LOCALIZATION  09/01/2012   Procedure: PARTIAL MASTECTOMY WITH NEEDLE LOCALIZATION;  Surgeon: Marcello Moores A. Cornett, MD;  Location: [facility];  Service: General;  Laterality: Right;    Family History  Problem Relation Age of Onset  . Cancer Mother        multiple myeloma  . Cancer Brother        leukemia  . Cancer Maternal Aunt        breast  . Cancer Maternal Aunt   . COPD Maternal Aunt        breast - great aunt    Allergies  Allergen Reactions  . Sulfa Antibiotics Hives    Spreads all over the body    Current Outpatient Prescriptions on File Prior to Visit  Medication Sig Dispense Refill  . cholecalciferol (VITAMIN D) 1000 units tablet Take 1,000 Units by mouth daily.    Marland Kitchen  ibuprofen (ADVIL,MOTRIN) 200 MG tablet Take 600 mg by mouth every 6 (six) hours as needed for headache or moderate pain.    . Multiple Vitamins-Minerals (EMERGEN-C VITAMIN C) PACK Take 1 packet by mouth daily as needed (immune support).    . valACYclovir (VALTREX) 500 MG tablet Take 500 mg by mouth 2 (two) times daily as needed. For outbreaks    . Carboxymethylcellul-Glycerin (LUBRICATING EYE DROPS OP) Apply 1 drop to eye daily as needed (dry eyes).    . cetirizine (ZYRTEC) 10 MG tablet Take 10 mg by mouth daily as needed for allergies.      No current facility-administered medications on file prior to visit.     BP 118/68 (BP Location:  Right Arm, Patient Position: Sitting, Cuff Size: Normal)   Pulse 83   Temp 98.2 F (36.8 C) (Oral)   Ht _0  (1.702 m)   Wt 165 lb 3.2 oz (74.9 kg)   SpO2 98%   BMI 25.87 kg/m       Objective:   Physical Exam   General  Mental Status - Alert. General Appearance - Well groomed. Not in acute distress.  Skin Rashes- No Rashes.  HEENT Head- Normal. Ear Auditory Canal - Left- Normal. Right - Normal.Tympanic Membrane- Left- Normal. Right- Normal. Eye Sclera/Conjunctiva- Left- Normal. Right- Normal. Nose & Sinuses Nasal Mucosa- Left-  Boggy and Congested. Right-  Boggy and  Congested.Bilateral  No maxillary and no  frontal sinus pressure. Mouth & Throat Lips: Upper Lip- Normal: no dryness, cracking, pallor, cyanosis, or vesicular eruption. Lower Lip-Normal: no dryness, cracking, pallor, cyanosis or vesicular eruption. Buccal Mucosa- Bilateral- No Aphthous ulcers. Oropharynx- No Discharge or Erythema. Tonsils: Characteristics- Bilateral- moderate Erythema. Size/Enlargement- Bilateral- 1+ enlargement. Discharge- bilateral-None.  Neck Neck- Supple. No Masses.   Chest and Lung Exam Auscultation: Breath Sounds:-Clear even and unlabored.  Cardiovascular Auscultation:Rythm- Regular, rate and rhythm. Murmurs & Other Heart Sounds:Ausculatation of the heart reveal- No Murmurs.  Lymphatic Head & Neck General Head & Neck Lymphatics: Bilateral: Description- faint submandibular nodes enlarged.       Assessment & Plan:  You appear to have bronchitis. Rest hydrate and tylenol for fever. I am prescribing  benzonatate cough medicine, and azithromycin antibiotic. For your nasal congestion Flonase.  You should gradually get better. If not then notify us and would recommend a chest xray.  You do also have recent moderate sore throat and your throat has moderate redness on exam. Your rapid strep was negative but will send out throat culture. Azithromycin does have good coverage for  most cases of strep throat. If the  send out test is positive and you still have lingering sore throat then would prescribe additional antibiotic.  Your flu test was negative today. Follow up in 7-10 days or as needed

## 2017-04-16 LAB — POCT RAPID STREP A (OFFICE): Rapid Strep A Screen: NEGATIVE

## 2017-04-16 LAB — POCT INFLUENZA A/B
Influenza A, POC: NEGATIVE
Influenza B, POC: NEGATIVE

## 2017-04-16 NOTE — Telephone Encounter (Signed)
Orders in and resulted

## 2017-04-16 NOTE — Addendum Note (Signed)
Addended by: Hinton Dyer on: 04/16/2017 09:07 AM   Modules accepted: Orders

## 2017-04-17 LAB — CULTURE, GROUP A STREP
MICRO NUMBER:: 81035981
SPECIMEN QUALITY:: ADEQUATE

## 2017-04-21 ENCOUNTER — Telehealth: Payer: Self-pay | Admitting: Medical

## 2017-04-21 DIAGNOSIS — R0989 Other specified symptoms and signs involving the circulatory and respiratory systems: Secondary | ICD-10-CM

## 2017-04-21 MED ORDER — LEVOCETIRIZINE DIHYDROCHLORIDE 5 MG PO TABS: 5.0000 mg | ORAL_TABLET | Freq: Every evening | ORAL | 0 refills | Status: DC

## 2017-04-21 NOTE — Telephone Encounter (Signed)
Future chest xray order placed. 

## 2017-04-25 ENCOUNTER — Ambulatory Visit (HOSPITAL_BASED_OUTPATIENT_CLINIC_OR_DEPARTMENT_OTHER)
Admission: RE | Admit: 2017-04-25 | Discharge: 2017-04-25 | Disposition: A | Discharge status: Home / Self Care | Payer: BLUE CROSS/BLUE SHIELD | Source: Ambulatory Visit | Attending: Medical | Admitting: Medical

## 2017-04-25 DIAGNOSIS — R0989 Other specified symptoms and signs involving the circulatory and respiratory systems: Secondary | ICD-10-CM | POA: Insufficient documentation

## 2017-04-25 DIAGNOSIS — R05 Cough: Secondary | ICD-10-CM | POA: Diagnosis not present

## 2017-05-11 ENCOUNTER — Encounter: Payer: BLUE CROSS/BLUE SHIELD | Admitting: Family Medicine

## 2017-05-17 NOTE — Progress Notes (Addendum)
[facility] [address] [place], [place] [address] [phone]  Date:  05/20/2017   Name:  Sheryl Bryan   DOB:  Mar 08, 1961   MRN:  [id number]  PCP:  Darreld Mclean, MD    Chief Complaint: Annual Exam (Pt states that she's having lower back pain. Onset about a month, Pt states that she's been taking advil with no relief)   History of Present Illness:  Sheryl Bryan is a 56 y.o. very pleasant female patient who presents with the following: I last saw here a little over a year ago except she did come in with bronchitis about a month ago- she is much better now  Here today for a CPE Generally in good health Hep C screening due Flu shot due- done already  GYN care- Dr. Garwin Brothers, will see next month for pap and mammo Colon cancer screening: colonoscopy 2015 Due for labs today  She is not fasting today Her follow-up breast imaging has been ok  Her seasonal allergies have not been that bad She is going to the gym 3x a week- she is running for exercise and enjoying it  She and her husband plan to do a 5k in the spring   She does use valtrex for rare HSV outbreaks- she is generally able to catch these at the start and minimize her sx. Does need a refill to have on hand  She has noted some right lower back pain over the SI joint for a month or so Relieved by NSAIDs  NKI No numbness or weakness of leg  Patient Active Problem List   Diagnosis Date Noted  . Insomnia 03/27/2016  . Atypical ductal hyperplasia of right breast 08/22/2015  . Post-operative state 09/20/2012    Past Medical History:  Diagnosis Date  . GERD (gastroesophageal reflux disease)    occ  . Papilloma of breast   . UTI (urinary tract infection)     Past Surgical History:  Procedure Laterality Date  . BREAST LUMPECTOMY WITH RADIOACTIVE SEED LOCALIZATION Right 09/11/2016   Procedure: RIGHT BREAST LUMPECTOMY WITH RADIOACTIVE SEED  LOCALIZATION;  Surgeon: Erroll Luna, MD;  Location: [facility];  Service: General;  Laterality: Right;  . CESAREAN SECTION  12/04/93, 01/05/79  . ENDOMETRIAL ABLATION  2008  . FRACTURE SURGERY     rt pinkie toe  . PARTIAL MASTECTOMY WITH NEEDLE LOCALIZATION  09/01/2012   Procedure: PARTIAL MASTECTOMY WITH NEEDLE LOCALIZATION;  Surgeon: Marcello Moores A. Cornett, MD;  Location: [facility];  Service: General;  Laterality: Right;    Social History  Substance Use Topics  . Smoking status: Never Smoker  . Smokeless tobacco: Never Used  . Alcohol use Yes     Comment: occasional    Family History  Problem Relation Age of Onset  . Cancer Mother        multiple myeloma  . Cancer Brother        leukemia  . Cancer Maternal Aunt        breast  . Cancer Maternal Aunt   . COPD Maternal Aunt        breast - great aunt    Allergies  Allergen Reactions  . Sulfa Antibiotics Hives    Spreads all over the body    Medication list has been reviewed and updated.  Current Outpatient Prescriptions on File Prior to Visit  Medication Sig Dispense Refill  . Carboxymethylcellul-Glycerin (LUBRICATING EYE DROPS OP) Apply  1 drop to eye daily as needed (dry eyes).    . cholecalciferol (VITAMIN D) 1000 units tablet Take 1,000 Units by mouth daily.    Marland Kitchen ibuprofen (ADVIL,MOTRIN) 200 MG tablet Take 600 mg by mouth every 6 (six) hours as needed for headache or moderate pain.    . valACYclovir (VALTREX) 500 MG tablet Take 500 mg by mouth 2 (two) times daily as needed. For outbreaks     No current facility-administered medications on file prior to visit.     Review of Systems:  As per HPI- otherwise negative. No CP or SOB even with running She has noted some aching in her right lower back for a month or so- does not run down her leg   Physical Examination: Vitals:   05/20/17 1407  BP: 128/80  Pulse: 90  Temp: 98.1 F (36.7 C)  SpO2: 97%   Vitals:   05/20/17 1407  Weight: 167 lb (75.8 kg)  Height: 5' 7"   (1.702 m)   Body mass index is 26.16 kg/m. Ideal Body Weight: Weight in (lb) to have BMI = 25: 159.3  GEN: WDWN, NAD, Non-toxic, A & O x 3, looks well and younger than age  89: Atraumatic, Normocephalic. Neck supple. No masses, No LAD.  Bilateral TM wnl, oropharynx normal.  PEERL,EOMI.   Ears and Nose: No external deformity. CV: RRR, No M/G/R. No JVD. No thrill. No extra heart sounds. PULM: CTA B, no wheezes, crackles, rhonchi. No retractions. No resp. distress. No accessory muscle use. ABD: S, NT, ND. EXTR: No c/c/e NEURO Normal gait.  PSYCH: Normally interactive. Conversant. Not depressed or anxious appearing.  Calm demeanor.  Minimal TTP over right SI joint Otherwise back exam is benign- normal strength and DTR of both LE, negative SLR Normal ROM of spine  Assessment and Plan: Physical exam  Screening for deficiency anemia - Plan: CBC  Screening for diabetes mellitus - Plan: Comprehensive metabolic panel, Hemoglobin A1c  Screening for hyperlipidemia - Plan: Lipid panel  Encounter for hepatitis C screening test for low risk patient - Plan: Hepatitis C antibody  Vitamin D deficiency - Plan: Vitamin D (25 hydroxy)  HSV (herpes simplex virus) infection - Plan: valACYclovir (VALTREX) 500 MG tablet  Here today for a CPE Labs pending as above Encouraged continued exercise discussed likely SI joint pain- she will treat with NSAIDs at home for a week or so- if not better I can then refer to sport med Will plan further follow- up pending labs.   Signed Lamar Blinks, MD  Received her labs 10/25  Results for orders placed or performed in visit on 05/20/17  CBC  Result Value Ref Range   WBC 5.2 4.0 - 10.5 K/uL   RBC 4.81 3.87 - 5.11 Mil/uL   Platelets 209.0 150.0 - 400.0 K/uL   Hemoglobin 12.6 12.0 - 15.0 g/dL   HCT 39.5 36.0 - 46.0 %   MCV 82.0 78.0 - 100.0 fl   MCHC 31.8 30.0 - 36.0 g/dL   RDW 14.4 11.5 - 15.5 %  Comprehensive metabolic panel  Result Value Ref  Range   Sodium 143 135 - 145 mEq/L   Potassium 4.8 3.5 - 5.1 mEq/L   Chloride 106 96 - 112 mEq/L   CO2 32 19 - 32 mEq/L   Glucose, Bld 111 (H) 70 - 99 mg/dL   BUN 12 6 - 23 mg/dL   Creatinine, Ser 0.84 0.40 - 1.20 mg/dL   Total Bilirubin 0.3 0.2 - 1.2 mg/dL  Alkaline Phosphatase 64 39 - 117 U/L   AST 16 0 - 37 U/L   ALT 8 0 - 35 U/L   Total Protein 6.5 6.0 - 8.3 g/dL   Albumin 4.1 3.5 - 5.2 g/dL   Calcium 9.5 8.4 - 10.5 mg/dL   GFR 90.85 >60.00 mL/min  Hepatitis C antibody  Result Value Ref Range   Hepatitis C Ab NON-REACTIVE NON-REACTI   SIGNAL TO CUT-OFF 0.02 <1.00  Lipid panel  Result Value Ref Range   Cholesterol 173 0 - 200 mg/dL   Triglycerides 92.0 0.0 - 149.0 mg/dL   HDL 57.30 >39.00 mg/dL   VLDL 18.4 0.0 - 40.0 mg/dL   LDL Cholesterol 97 0 - 99 mg/dL   Total CHOL/HDL Ratio 3    NonHDL 115.57   Hemoglobin A1c  Result Value Ref Range   Hgb A1c MFr Bld 6.1 4.6 - 6.5 %  Vitamin D (25 hydroxy)  Result Value Ref Range   VITD 22.26 (L) 30.00 - 100.00 ng/mL

## 2017-05-20 ENCOUNTER — Encounter: Payer: Self-pay | Admitting: Family Medicine

## 2017-05-20 ENCOUNTER — Ambulatory Visit (INDEPENDENT_AMBULATORY_CARE_PROVIDER_SITE_OTHER): Payer: BLUE CROSS/BLUE SHIELD | Admitting: Family Medicine

## 2017-05-20 VITALS — BP 128/80 | HR 90 | Temp 98.1°F | Ht 67.0 in | Wt 167.0 lb

## 2017-05-20 DIAGNOSIS — Z131 Encounter for screening for diabetes mellitus: Secondary | ICD-10-CM

## 2017-05-20 DIAGNOSIS — Z13220 Encounter for screening for lipoid disorders: Secondary | ICD-10-CM | POA: Diagnosis not present

## 2017-05-20 DIAGNOSIS — B009 Herpesviral infection, unspecified: Secondary | ICD-10-CM | POA: Diagnosis not present

## 2017-05-20 DIAGNOSIS — R7303 Prediabetes: Secondary | ICD-10-CM

## 2017-05-20 DIAGNOSIS — E559 Vitamin D deficiency, unspecified: Secondary | ICD-10-CM | POA: Diagnosis not present

## 2017-05-20 DIAGNOSIS — Z130 Encounter for screening for diseases of the blood and blood-forming organs and certain disorders involving the immune mechanism: Secondary | ICD-10-CM

## 2017-05-20 DIAGNOSIS — Z1159 Encounter for screening for other viral diseases: Secondary | ICD-10-CM

## 2017-05-20 DIAGNOSIS — Z0000 Encounter for general adult medical examination without abnormal findings: Secondary | ICD-10-CM | POA: Diagnosis not present

## 2017-05-20 MED ORDER — VALACYCLOVIR HCL 500 MG PO TABS: 500.0000 mg | ORAL_TABLET | Freq: Two times a day (BID) | ORAL | 2 refills | Status: DC | PRN

## 2017-05-20 NOTE — Patient Instructions (Addendum)
Continue to use the valtrex as needed for HSV outbreaks I will be in touch with your labs asap I think you are having SI joint pain on the right. Try using an NSAID like ibuprofen or aleve twice a day for 5-7 days (just follow direction on the package); however if this does not get better please let me know and we can look further   Health Maintenance for Postmenopausal Women Menopause is a normal process in which your reproductive ability comes to an end. This process happens gradually over a span of months to years, usually between the ages of 76 and 73. Menopause is complete when you have missed 12 consecutive menstrual periods. It is important to talk with your health care provider about some of the most common conditions that affect postmenopausal women, such as heart disease, cancer, and bone loss (osteoporosis). Adopting a healthy lifestyle and getting preventive care can help to promote your health and wellness. Those actions can also lower your chances of developing some of these common conditions. What should I know about menopause? During menopause, you may experience a number of symptoms, such as:  Moderate-to-severe hot flashes.  Night sweats.  Decrease in sex drive.  Mood swings.  Headaches.  Tiredness.  Irritability.  Memory problems.  Insomnia.  Choosing to treat or not to treat menopausal changes is an individual decision that you make with your health care provider. What should I know about hormone replacement therapy and supplements? Hormone therapy products are effective for treating symptoms that are associated with menopause, such as hot flashes and night sweats. Hormone replacement carries certain risks, especially as you become older. If you are thinking about using estrogen or estrogen with progestin treatments, discuss the benefits and risks with your health care provider. What should I know about heart disease and stroke? Heart disease, heart attack, and  stroke become more likely as you age. This may be due, in part, to the hormonal changes that your body experiences during menopause. These can affect how your body processes dietary fats, triglycerides, and cholesterol. Heart attack and stroke are both medical emergencies. There are many things that you can do to help prevent heart disease and stroke:  Have your blood pressure checked at least every 1-2 years. High blood pressure causes heart disease and increases the risk of stroke.  If you are 43-66 years old, ask your health care provider if you should take aspirin to prevent a heart attack or a stroke.  Do not use any tobacco products, including cigarettes, chewing tobacco, or electronic cigarettes. If you need help quitting, ask your health care provider.  It is important to eat a healthy diet and maintain a healthy weight. ? Be sure to include plenty of vegetables, fruits, low-fat dairy products, and lean protein. ? Avoid eating foods that are high in solid fats, added sugars, or salt (sodium).  Get regular exercise. This is one of the most important things that you can do for your health. ? Try to exercise for at least 150 minutes each week. The type of exercise that you do should increase your heart rate and make you sweat. This is known as moderate-intensity exercise. ? Try to do strengthening exercises at least twice each week. Do these in addition to the moderate-intensity exercise.  Know your numbers.Ask your health care provider to check your cholesterol and your blood glucose. Continue to have your blood tested as directed by your health care provider.  What should I know about cancer  screening? There are several types of cancer. Take the following steps to reduce your risk and to catch any cancer development as early as possible. Breast Cancer  Practice breast self-awareness. ? This means understanding how your breasts normally appear and feel. ? It also means doing regular  breast self-exams. Let your health care provider know about any changes, no matter how small.  If you are 76 or older, have a clinician do a breast exam (clinical breast exam or CBE) every year. Depending on your age, family history, and medical history, it may be recommended that you also have a yearly breast X-ray (mammogram).  If you have a family history of breast cancer, talk with your health care provider about genetic screening.  If you are at high risk for breast cancer, talk with your health care provider about having an MRI and a mammogram every year.  Breast cancer (BRCA) gene test is recommended for women who have family members with BRCA-related cancers. Results of the assessment will determine the need for genetic counseling and BRCA1 and for BRCA2 testing. BRCA-related cancers include these types: ? Breast. This occurs in males or females. ? Ovarian. ? Tubal. This may also be called fallopian tube cancer. ? Cancer of the abdominal or pelvic lining (peritoneal cancer). ? Prostate. ? Pancreatic.  Cervical, Uterine, and Ovarian Cancer Your health care provider may recommend that you be screened regularly for cancer of the pelvic organs. These include your ovaries, uterus, and vagina. This screening involves a pelvic exam, which includes checking for microscopic changes to the surface of your cervix (Pap test).  For women ages 21-65, health care providers may recommend a pelvic exam and a Pap test every three years. For women ages 26-65, they may recommend the Pap test and pelvic exam, combined with testing for human papilloma virus (HPV), every five years. Some types of HPV increase your risk of cervical cancer. Testing for HPV may also be done on women of any age who have unclear Pap test results.  Other health care providers may not recommend any screening for nonpregnant women who are considered low risk for pelvic cancer and have no symptoms. Ask your health care provider if a  screening pelvic exam is right for you.  If you have had past treatment for cervical cancer or a condition that could lead to cancer, you need Pap tests and screening for cancer for at least 20 years after your treatment. If Pap tests have been discontinued for you, your risk factors (such as having a new sexual partner) need to be reassessed to determine if you should start having screenings again. Some women have medical problems that increase the chance of getting cervical cancer. In these cases, your health care provider may recommend that you have screening and Pap tests more often.  If you have a family history of uterine cancer or ovarian cancer, talk with your health care provider about genetic screening.  If you have vaginal bleeding after reaching menopause, tell your health care provider.  There are currently no reliable tests available to screen for ovarian cancer.  Lung Cancer Lung cancer screening is recommended for adults 34-66 years old who are at high risk for lung cancer because of a history of smoking. A yearly low-dose CT scan of the lungs is recommended if you:  Currently smoke.  Have a history of at least 30 pack-years of smoking and you currently smoke or have quit within the past 15 years. A pack-year is smoking  an average of one pack of cigarettes per day for one year.  Yearly screening should:  Continue until it has been 15 years since you quit.  Stop if you develop a health problem that would prevent you from having lung cancer treatment.  Colorectal Cancer  This type of cancer can be detected and can often be prevented.  Routine colorectal cancer screening usually begins at age 62 and continues through age 35.  If you have risk factors for colon cancer, your health care provider may recommend that you be screened at an earlier age.  If you have a family history of colorectal cancer, talk with your health care provider about genetic screening.  Your health  care provider may also recommend using home test kits to check for hidden blood in your stool.  A small camera at the end of a tube can be used to examine your colon directly (sigmoidoscopy or colonoscopy). This is done to check for the earliest forms of colorectal cancer.  Direct examination of the colon should be repeated every 5-10 years until age 19. However, if early forms of precancerous polyps or small growths are found or if you have a family history or genetic risk for colorectal cancer, you may need to be screened more often.  Skin Cancer  Check your skin from head to toe regularly.  Monitor any moles. Be sure to tell your health care provider: ? About any new moles or changes in moles, especially if there is a change in a mole's shape or color. ? If you have a mole that is larger than the size of a pencil eraser.  If any of your family members has a history of skin cancer, especially at a young age, talk with your health care provider about genetic screening.  Always use sunscreen. Apply sunscreen liberally and repeatedly throughout the day.  Whenever you are outside, protect yourself by wearing long sleeves, pants, a wide-brimmed hat, and sunglasses.  What should I know about osteoporosis? Osteoporosis is a condition in which bone destruction happens more quickly than new bone creation. After menopause, you may be at an increased risk for osteoporosis. To help prevent osteoporosis or the bone fractures that can happen because of osteoporosis, the following is recommended:  If you are 72-28 years old, get at least 1,000 mg of calcium and at least 600 mg of vitamin D per day.  If you are older than age 31 but younger than age 40, get at least 1,200 mg of calcium and at least 600 mg of vitamin D per day.  If you are older than age 74, get at least 1,200 mg of calcium and at least 800 mg of vitamin D per day.  Smoking and excessive alcohol intake increase the risk of  osteoporosis. Eat foods that are rich in calcium and vitamin D, and do weight-bearing exercises several times each week as directed by your health care provider. What should I know about how menopause affects my mental health? Depression may occur at any age, but it is more common as you become older. Common symptoms of depression include:  Low or sad mood.  Changes in sleep patterns.  Changes in appetite or eating patterns.  Feeling an overall lack of motivation or enjoyment of activities that you previously enjoyed.  Frequent crying spells.  Talk with your health care provider if you think that you are experiencing depression. What should I know about immunizations? It is important that you get and maintain your  immunizations. These include:  Tetanus, diphtheria, and pertussis (Tdap) booster vaccine.  Influenza every year before the flu season begins.  Pneumonia vaccine.  Shingles vaccine.  Your health care provider may also recommend other immunizations. This information is not intended to replace advice given to you by your health care provider. Make sure you discuss any questions you have with your health care provider. Document Released: 09/05/2005 Document Revised: 02/01/2016 Document Reviewed: 04/17/2015 Elsevier Interactive Patient Education  2018 [employer].

## 2017-05-21 ENCOUNTER — Encounter: Payer: Self-pay | Admitting: Family Medicine

## 2017-05-21 DIAGNOSIS — E559 Vitamin D deficiency, unspecified: Secondary | ICD-10-CM | POA: Insufficient documentation

## 2017-05-21 DIAGNOSIS — R7303 Prediabetes: Secondary | ICD-10-CM | POA: Insufficient documentation

## 2017-05-21 LAB — COMPREHENSIVE METABOLIC PANEL
ALT: 8 U/L (ref 0–35)
AST: 16 U/L (ref 0–37)
Albumin: 4.1 g/dL (ref 3.5–5.2)
Alkaline Phosphatase: 64 U/L (ref 39–117)
BUN: 12 mg/dL (ref 6–23)
CO2: 32 mEq/L (ref 19–32)
Calcium: 9.5 mg/dL (ref 8.4–10.5)
Chloride: 106 mEq/L (ref 96–112)
Creatinine, Ser: 0.84 mg/dL (ref 0.40–1.20)
GFR: 90.85 mL/min (ref >60.00)
Glucose, Bld: 111 mg/dL — ABNORMAL HIGH (ref 70–99)
Potassium: 4.8 mEq/L (ref 3.5–5.1)
Sodium: 143 mEq/L (ref 135–145)
Total Bilirubin: 0.3 mg/dL (ref 0.2–1.2)
Total Protein: 6.5 g/dL (ref 6.0–8.3)

## 2017-05-21 LAB — VITAMIN D 25 HYDROXY (VIT D DEFICIENCY, FRACTURES): VITD: 22.26 ng/mL — ABNORMAL LOW (ref 30.00–100.00)

## 2017-05-21 LAB — LIPID PANEL
Cholesterol: 173 mg/dL (ref 0–200)
HDL: 57.3 mg/dL (ref >39.00)
LDL Cholesterol: 97 mg/dL (ref 0–99)
NonHDL: 115.57
Total CHOL/HDL Ratio: 3
Triglycerides: 92 mg/dL (ref 0.0–149.0)
VLDL: 18.4 mg/dL (ref 0.0–40.0)

## 2017-05-21 LAB — CBC
HCT: 39.5 % (ref 36.0–46.0)
Hemoglobin: 12.6 g/dL (ref 12.0–15.0)
MCHC: 31.8 g/dL (ref 30.0–36.0)
MCV: 82 fl (ref 78.0–100.0)
Platelets: 209 10*3/uL (ref 150.0–400.0)
RBC: 4.81 Mil/uL (ref 3.87–5.11)
RDW: 14.4 % (ref 11.5–15.5)
WBC: 5.2 10*3/uL (ref 4.0–10.5)

## 2017-05-21 LAB — HEPATITIS C ANTIBODY
Hepatitis C Ab: NONREACTIVE
SIGNAL TO CUT-OFF: 0.02 (ref <1.00)

## 2017-05-21 LAB — HEMOGLOBIN A1C: Hgb A1c MFr Bld: 6.1 % (ref 4.6–6.5)

## 2017-05-21 MED ORDER — VITAMIN D (ERGOCALCIFEROL) 1.25 MG (50000 UNIT) PO CAPS: 50000.0000 [IU] | ORAL_CAPSULE | ORAL | 0 refills | Status: DC

## 2017-05-21 NOTE — Addendum Note (Signed)
Addended by: Lamar Blinks C on: 05/21/2017 09:18 PM   Modules accepted: Orders

## 2017-05-26 ENCOUNTER — Other Ambulatory Visit: Payer: Self-pay | Admitting: Medical

## 2017-06-26 ENCOUNTER — Ambulatory Visit: Payer: BLUE CROSS/BLUE SHIELD | Admitting: Family

## 2017-06-26 ENCOUNTER — Other Ambulatory Visit (HOSPITAL_COMMUNITY)
Admission: RE | Admit: 2017-06-26 | Discharge: 2017-06-26 | Disposition: A | Discharge status: Home / Self Care | Payer: BLUE CROSS/BLUE SHIELD | Source: Ambulatory Visit | Attending: Family | Admitting: Family

## 2017-06-26 ENCOUNTER — Encounter: Payer: Self-pay | Admitting: Family

## 2017-06-26 VITALS — BP 132/73 | HR 87 | Temp 98.4°F | Resp 16 | Ht 67.0 in | Wt 165.6 lb

## 2017-06-26 DIAGNOSIS — N898 Other specified noninflammatory disorders of vagina: Secondary | ICD-10-CM | POA: Insufficient documentation

## 2017-06-26 LAB — POC URINALSYSI DIPSTICK (AUTOMATED)
Bilirubin, UA: NEGATIVE
Blood, UA: NEGATIVE
Glucose, UA: NEGATIVE
Ketones, UA: NEGATIVE
Leukocytes, UA: NEGATIVE
Nitrite, UA: NEGATIVE
Protein, UA: NEGATIVE
Spec Grav, UA: 1.02 (ref 1.010–1.025)
Urobilinogen, UA: 0.2 E.U./dL
pH, UA: 6 (ref 5.0–8.0)

## 2017-06-26 NOTE — Progress Notes (Signed)
Subjective:    Patient ID: Sheryl Bryan, female    DOB: May 30, 1961, 56 y.o.   MRN: [id number]  HPI  [name] is a 56 yr old female who presents today with chief complaint of vaginal irritation. Symptoms began about 3 days ago. Denies dysuria.  Does have urinary frequency.  Very mild discharge.  No new sexual partners.     Review of Systems See HPI  Past Medical History:  Diagnosis Date  . GERD (gastroesophageal reflux disease)    occ  . Papilloma of breast   . UTI (urinary tract infection)      Social History   Socioeconomic History  . Marital status: Married    Spouse name: Not on file  . Number of children: Not on file  . Years of education: Not on file  . Highest education level: Not on file  Social Needs  . Financial resource strain: Not on file  . Food insecurity - worry: Not on file  . Food insecurity - inability: Not on file  . Transportation needs - medical: Not on file  . Transportation needs - non-medical: Not on file  Occupational History  . Not on file  Tobacco Use  . Smoking status: Never Smoker  . Smokeless tobacco: Never Used  Substance and Sexual Activity  . Alcohol use: Yes    Comment: occasional  . Drug use: No  . Sexual activity: Not on file  Other Topics Concern  . Not on file  Social History Narrative  . Not on file    Past Surgical History:  Procedure Laterality Date  . BREAST LUMPECTOMY WITH RADIOACTIVE SEED LOCALIZATION Right 09/11/2016   Procedure: RIGHT BREAST LUMPECTOMY WITH RADIOACTIVE SEED LOCALIZATION;  Surgeon: Erroll Luna, MD;  Location: [place];  Service: General;  Laterality: Right;  . CESAREAN SECTION  12/04/93, 01/05/79  . ENDOMETRIAL ABLATION  2008  . FRACTURE SURGERY     rt pinkie toe  . PARTIAL MASTECTOMY WITH NEEDLE LOCALIZATION  09/01/2012   Procedure: PARTIAL MASTECTOMY WITH NEEDLE LOCALIZATION;  Surgeon: Marcello Moores A. Cornett, MD;  Location: [facility];  Service: General;  Laterality: Right;    Family History    Problem Relation Age of Onset  . Cancer Mother        multiple myeloma  . Cancer Brother        leukemia  . Cancer Maternal Aunt        breast  . Cancer Maternal Aunt   . COPD Maternal Aunt        breast - great aunt    Allergies  Allergen Reactions  . Sulfa Antibiotics Hives    Spreads all over the body    Current Outpatient Medications on File Prior to Visit  Medication Sig Dispense Refill  . levocetirizine (XYZAL) 5 MG tablet TAKE 1 TABLET BY MOUTH EVERY DAY IN THE EVENING 30 tablet 0  . valACYclovir (VALTREX) 500 MG tablet Take 1 tablet (500 mg total) by mouth 2 (two) times daily as needed. For outbreaks 30 tablet 2  . Vitamin D, Ergocalciferol, (DRISDOL) 50000 units CAPS capsule Take 1 capsule (50,000 Units total) by mouth every 7 (seven) days. 12 capsule 0  . ibuprofen (ADVIL,MOTRIN) 200 MG tablet Take 600 mg by mouth every 6 (six) hours as needed for headache or moderate pain.     No current facility-administered medications on file prior to visit.     BP 132/73 (BP Location: Left Arm, Cuff Size: Normal)   Pulse 87  Temp 98.4 F (36.9 C) (Oral)   Resp 16   Ht 5' 7"  (1.702 m)   Wt 165 lb 9.6 oz (75.1 kg)   SpO2 99%   BMI 25.94 kg/m       Objective:   Physical Exam  Constitutional: She is oriented to person, place, and time. She appears well-developed and well-nourished. No distress.  Genitourinary: Vagina normal.  Genitourinary Comments: Mild vaginal dryness noted on exam  Musculoskeletal: She exhibits no edema.  Neurological: She is alert and oriented to person, place, and time.  Psychiatric: She has a normal mood and affect. Her behavior is normal. Judgment and thought content normal.          Assessment & Plan:  Vaginal irritation- ? Infection versus atrophic vaginitis. She has upcoming apt with GYN next week and I have advised her to keep this appointment to discuss vaginal dryness. In the meantime, will sent vaginal swab for yeast, bacteria,  bv/trich.   Urinalysis is unremarkable. Will send for culture to confirm.

## 2017-06-26 NOTE — Addendum Note (Signed)
Addended by: Kelle Darting A on: 06/26/2017 10:10 AM   Modules accepted: Orders

## 2017-06-26 NOTE — Patient Instructions (Signed)
We will let you know your swab results turn out.

## 2017-06-27 LAB — URINE CULTURE
MICRO NUMBER:: 81348214
Result:: NO GROWTH
SPECIMEN QUALITY:: ADEQUATE

## 2017-06-29 ENCOUNTER — Encounter: Payer: Self-pay | Admitting: Family

## 2017-06-29 ENCOUNTER — Telehealth: Payer: Self-pay | Admitting: Family

## 2017-06-29 LAB — CERVICOVAGINAL ANCILLARY ONLY
Bacterial vaginitis: POSITIVE — AB
Candida vaginitis: NEGATIVE
Chlamydia: NEGATIVE
Neisseria Gonorrhea: NEGATIVE
Trichomonas: NEGATIVE

## 2017-06-29 MED ORDER — FLUCONAZOLE 150 MG PO TABS: 150.0000 mg | ORAL_TABLET | Freq: Once | ORAL | 0 refills | Status: DC

## 2017-06-29 MED ORDER — METRONIDAZOLE 500 MG PO TABS
500.0000 mg | ORAL_TABLET | Freq: Two times a day (BID) | ORAL | 0 refills | Status: DC
Start: 2017-06-29 — End: 2017-09-07

## 2017-06-29 NOTE — Telephone Encounter (Signed)
Got back results + for gardnerella.  Please cancel rx for diflucan if she has not yet picked up and notify her not to take (I had responded to her mychart message)  Will rx metronidazole instead. No ETOH while on metronidazole.

## 2017-06-29 NOTE — Telephone Encounter (Signed)
Mychart message sent to pt.  Diflucan Rx has been cancelled and they will fill metronidazole.

## 2017-06-29 NOTE — Telephone Encounter (Signed)
Melissa-- Result is still in process and I have notified pt.

## 2017-07-02 DIAGNOSIS — Z1151 Encounter for screening for human papillomavirus (HPV): Secondary | ICD-10-CM | POA: Diagnosis not present

## 2017-07-02 DIAGNOSIS — Z01419 Encounter for gynecological examination (general) (routine) without abnormal findings: Secondary | ICD-10-CM | POA: Diagnosis not present

## 2017-07-02 DIAGNOSIS — Z6825 Body mass index (BMI) 25.0-25.9, adult: Secondary | ICD-10-CM | POA: Diagnosis not present

## 2017-07-02 DIAGNOSIS — Z1231 Encounter for screening mammogram for malignant neoplasm of breast: Secondary | ICD-10-CM | POA: Diagnosis not present

## 2017-07-27 ENCOUNTER — Telehealth: Payer: Self-pay | Admitting: Emergency Medicine

## 2017-07-27 DIAGNOSIS — M533 Sacrococcygeal disorders, not elsewhere classified: Principal | ICD-10-CM

## 2017-07-27 DIAGNOSIS — G8929 Other chronic pain: Secondary | ICD-10-CM

## 2017-07-27 NOTE — Telephone Encounter (Signed)
Called her back- she has continued to have pain in her right lower back/ SI joint.  I will refer her to sports med

## 2017-07-27 NOTE — Telephone Encounter (Signed)
Copied from [place] [id number]. Topic: Referral - Request >> Jul 27, 2017  9:15 AM Arletha Grippe wrote: Reason for CRM: pt would like to have referral to sports medicine for her back pain. It is continuing.  Please call [phone]

## 2017-08-04 ENCOUNTER — Ambulatory Visit: Payer: BLUE CROSS/BLUE SHIELD | Admitting: Family Medicine

## 2017-08-04 ENCOUNTER — Encounter: Payer: Self-pay | Admitting: Family Medicine

## 2017-08-04 DIAGNOSIS — M545 Low back pain, unspecified: Secondary | ICD-10-CM

## 2017-08-04 MED ORDER — MELOXICAM 7.5 MG PO TABS: 7.5000 mg | ORAL_TABLET | Freq: Every day | ORAL | 2 refills | Status: DC

## 2017-08-04 NOTE — Patient Instructions (Signed)
You have a lumbar strain. Ok to take tylenol for baseline pain relief (1-2 extra strength tabs 3x/day) Meloxicam 7.5mg  with food for pain and inflammation - typically take regularly for 7-10 days then as needed. Stay as active as possible. Do home exercises and stretches as directed - hold each for 20-30 seconds and do each one three times. Consider massage, chiropractor, physical therapy, and/or acupuncture. Physical therapy has been shown to be helpful while the others have mixed results. Strengthening of low back muscles, abdominal musculature are key for long term pain relief. If not improving, will consider physical therapy, imaging. Follow up with me in 6 weeks.

## 2017-08-05 ENCOUNTER — Encounter: Payer: Self-pay | Admitting: Family Medicine

## 2017-08-05 DIAGNOSIS — M545 Low back pain, unspecified: Secondary | ICD-10-CM | POA: Insufficient documentation

## 2017-08-05 NOTE — Progress Notes (Signed)
PCP and consultation requested by: Copland, Gay Filler, MD  Subjective:   HPI: Patient is a 57 y.o. female here for low back pain.  Patient denies known injury or trauma. She states for about 4 months she's had right sided low back pain. Noticed when turning over in bed at night. No radiation into legs but can go across to left side of back sometimes. Some icing, stretching. Has occasionally taken ibuprofen. Pain is dull and constant at 7/10 level. No numbness, bowel/bladder dysfunction.  Past Medical History:  Diagnosis Date  . GERD (gastroesophageal reflux disease)    occ  . Papilloma of breast   . UTI (urinary tract infection)     Current Outpatient Medications on File Prior to Visit  Medication Sig Dispense Refill  . ibuprofen (ADVIL,MOTRIN) 200 MG tablet Take 600 mg by mouth every 6 (six) hours as needed for headache or moderate pain.    Marland Kitchen levocetirizine (XYZAL) 5 MG tablet TAKE 1 TABLET BY MOUTH EVERY DAY IN THE EVENING 30 tablet 0  . metroNIDAZOLE (FLAGYL) 500 MG tablet Take 1 tablet (500 mg total) by mouth 2 (two) times daily. 14 tablet 0  . valACYclovir (VALTREX) 500 MG tablet Take 1 tablet (500 mg total) by mouth 2 (two) times daily as needed. For outbreaks 30 tablet 2  . Vitamin D, Ergocalciferol, (DRISDOL) 50000 units CAPS capsule Take 1 capsule (50,000 Units total) by mouth every 7 (seven) days. 12 capsule 0   No current facility-administered medications on file prior to visit.     Past Surgical History:  Procedure Laterality Date  . BREAST LUMPECTOMY WITH RADIOACTIVE SEED LOCALIZATION Right 09/11/2016   Procedure: RIGHT BREAST LUMPECTOMY WITH RADIOACTIVE SEED LOCALIZATION;  Surgeon: Erroll Luna, MD;  Location: [place];  Service: General;  Laterality: Right;  . CESAREAN SECTION  12/04/93, 01/05/79  . ENDOMETRIAL ABLATION  2008  . FRACTURE SURGERY     rt pinkie toe  . PARTIAL MASTECTOMY WITH NEEDLE LOCALIZATION  09/01/2012   Procedure: PARTIAL MASTECTOMY WITH NEEDLE  LOCALIZATION;  Surgeon: Marcello Moores A. Cornett, MD;  Location: [place];  Service: General;  Laterality: Right;    Allergies  Allergen Reactions  . Sulfa Antibiotics Hives    Spreads all over the body    Social History   Socioeconomic History  . Marital status: Married    Spouse name: Not on file  . Number of children: Not on file  . Years of education: Not on file  . Highest education level: Not on file  Social Needs  . Financial resource strain: Not on file  . Food insecurity - worry: Not on file  . Food insecurity - inability: Not on file  . Transportation needs - medical: Not on file  . Transportation needs - non-medical: Not on file  Occupational History  . Not on file  Tobacco Use  . Smoking status: Never Smoker  . Smokeless tobacco: Never Used  Substance and Sexual Activity  . Alcohol use: Yes    Comment: occasional  . Drug use: No  . Sexual activity: Not on file  Other Topics Concern  . Not on file  Social History Narrative  . Not on file    Family History  Problem Relation Age of Onset  . Cancer Mother        multiple myeloma  . Cancer Brother        leukemia  . Cancer Maternal Aunt        breast  . Cancer Maternal Aunt   .  COPD Maternal Aunt        breast - great aunt    BP 126/87   Pulse 85   Ht 5' 7"  (1.702 m)   Wt 160 lb (72.6 kg)   BMI 25.06 kg/m   Review of Systems: See HPI above.     Objective:  Physical Exam:  Gen: NAD, comfortable in exam room  Back: No gross deformity, scoliosis. TTP right > left paraspinal lumbar regions.  No SI, piriformis, trochanter, other tenderness. FROM with pain on trunk rotation worst to the right, some with flexion. Strength LEs 5/5 all muscle groups.   2+ MSRs in patellar and achilles tendons, equal bilaterally. Negative SLRs. Sensation intact to light touch bilaterally.  Bilateral hips: No deformity. No TTP. FROM with 5/5 strength. Negative logroll bilateral hips Negative fabers and piriformis  stretches.   Assessment & Plan:  1. Low back pain - consistent with lumbar strain.  She would like to try home exercise program and wait on physical therapy.  Meloxicam daily with food.  Consider PT, imaging of lumbar spine if not improving as expected.  F/u in 6 weeks.

## 2017-08-05 NOTE — Assessment & Plan Note (Signed)
consistent with lumbar strain.  She would like to try home exercise program and wait on physical therapy.  Meloxicam daily with food.  Consider PT, imaging of lumbar spine if not improving as expected.  F/u in 6 weeks.

## 2017-09-07 ENCOUNTER — Other Ambulatory Visit: Payer: Self-pay | Admitting: Family

## 2017-09-07 ENCOUNTER — Encounter: Payer: Self-pay | Admitting: Family

## 2017-09-07 MED ORDER — METRONIDAZOLE 500 MG PO TABS: 500.0000 mg | ORAL_TABLET | Freq: Two times a day (BID) | ORAL | 0 refills | Status: DC

## 2017-09-15 ENCOUNTER — Ambulatory Visit (INDEPENDENT_AMBULATORY_CARE_PROVIDER_SITE_OTHER): Payer: BLUE CROSS/BLUE SHIELD | Admitting: Family Medicine

## 2017-09-15 ENCOUNTER — Encounter: Payer: Self-pay | Admitting: Family Medicine

## 2017-09-15 DIAGNOSIS — M545 Low back pain, unspecified: Secondary | ICD-10-CM

## 2017-09-15 NOTE — Patient Instructions (Signed)
You have a lumbar strain. Take tylenol and/or meloxicam as needed for pain. Stay as active as possible. Do home exercises and stretches as directed - hold each for 20-30 seconds and do each one three times. Pick 3-5 of the exercises in the large packet and do these as well. Consider massage, chiropractor, physical therapy, and/or acupuncture. Physical therapy has been shown to be helpful while the others have mixed results. Strengthening of low back muscles, abdominal musculature are key for long term pain relief. If not improving, will consider physical therapy, imaging. Follow up with me in 6 weeks or as needed. Call me if you want to do physical therapy or an MRI of your back.

## 2017-09-15 NOTE — Progress Notes (Signed)
PCP and consultation requested by: Copland, Gay Filler, MD  Subjective:   HPI: Patient is a 57 y.o. female here for low back pain.  1/8: Patient denies known injury or trauma. She states for about 4 months she's had right sided low back pain. Noticed when turning over in bed at night. No radiation into legs but can go across to left side of back sometimes. Some icing, stretching. Has occasionally taken ibuprofen. Pain is dull and constant at 7/10 level. No numbness, bowel/bladder dysfunction.  2/19: Patient reports she feels about the same compared to last visit. Pain level 3-4/10. Tries stretching in bed which helps some. Pain radiates from low back into right hip. Ok during the day. Doing home exercises other times but not as much as she should and took mobic initially. Has been icing. No bowel/bladder dysfunction.  Past Medical History:  Diagnosis Date  . GERD (gastroesophageal reflux disease)    occ  . Papilloma of breast   . UTI (urinary tract infection)     Current Outpatient Medications on File Prior to Visit  Medication Sig Dispense Refill  . ibuprofen (ADVIL,MOTRIN) 200 MG tablet Take 600 mg by mouth every 6 (six) hours as needed for headache or moderate pain.    Marland Kitchen levocetirizine (XYZAL) 5 MG tablet TAKE 1 TABLET BY MOUTH EVERY DAY IN THE EVENING 30 tablet 0  . meloxicam (MOBIC) 7.5 MG tablet Take 1 tablet (7.5 mg total) by mouth daily. 30 tablet 2  . metroNIDAZOLE (FLAGYL) 500 MG tablet Take 1 tablet (500 mg total) by mouth 2 (two) times daily. 14 tablet 0  . valACYclovir (VALTREX) 500 MG tablet Take 1 tablet (500 mg total) by mouth 2 (two) times daily as needed. For outbreaks 30 tablet 2  . Vitamin D, Ergocalciferol, (DRISDOL) 50000 units CAPS capsule Take 1 capsule (50,000 Units total) by mouth every 7 (seven) days. 12 capsule 0   No current facility-administered medications on file prior to visit.     Past Surgical History:  Procedure Laterality Date  .  BREAST LUMPECTOMY WITH RADIOACTIVE SEED LOCALIZATION Right 09/11/2016   Procedure: RIGHT BREAST LUMPECTOMY WITH RADIOACTIVE SEED LOCALIZATION;  Surgeon: Erroll Luna, MD;  Location: [facility];  Service: General;  Laterality: Right;  . CESAREAN SECTION  12/04/93, 01/05/79  . ENDOMETRIAL ABLATION  2008  . FRACTURE SURGERY     rt pinkie toe  . PARTIAL MASTECTOMY WITH NEEDLE LOCALIZATION  09/01/2012   Procedure: PARTIAL MASTECTOMY WITH NEEDLE LOCALIZATION;  Surgeon: Marcello Moores A. Cornett, MD;  Location: [place];  Service: General;  Laterality: Right;    Allergies  Allergen Reactions  . Sulfa Antibiotics Hives    Spreads all over the body    Social History   Socioeconomic History  . Marital status: Married    Spouse name: Not on file  . Number of children: Not on file  . Years of education: Not on file  . Highest education level: Not on file  Social Needs  . Financial resource strain: Not on file  . Food insecurity - worry: Not on file  . Food insecurity - inability: Not on file  . Transportation needs - medical: Not on file  . Transportation needs - non-medical: Not on file  Occupational History  . Not on file  Tobacco Use  . Smoking status: Never Smoker  . Smokeless tobacco: Never Used  Substance and Sexual Activity  . Alcohol use: Yes    Comment: occasional  . Drug use: No  .  Sexual activity: Not on file  Other Topics Concern  . Not on file  Social History Narrative  . Not on file    Family History  Problem Relation Age of Onset  . Cancer Mother        multiple myeloma  . Cancer Brother        leukemia  . Cancer Maternal Aunt        breast  . Cancer Maternal Aunt   . COPD Maternal Aunt        breast - great aunt    BP (!) 144/87   Pulse 89   Ht 5' 7"  (1.702 m)   Wt 160 lb (72.6 kg)   BMI 25.06 kg/m   Review of Systems: See HPI above.     Objective:  Physical Exam:  Gen: NAD, comfortable in exam room.  Back: No gross deformity, scoliosis. TTP right lumbar  paraspinal region.  No midline or bony TTP. FROM with mild pain on trunk rotation. Strength LEs 5/5 all muscle groups.   2+ MSRs in patellar and achilles tendons, equal bilaterally. Negative SLRs. Sensation intact to light touch bilaterally. Negative logroll bilateral hips Negative fabers and piriformis stretches.   Assessment & Plan:  1. Low back pain - 2/2 lumbar strain.  She admits not being as diligent as she should about her home exercises - she would like to focus on this.  Tylenol, meloxicam only if needed.  Consider formal physical therapy, imaging of lumbar spine if not improving.  F/u in 6 weeks or prn.

## 2017-09-15 NOTE — Assessment & Plan Note (Signed)
2/2 lumbar strain.  She admits not being as diligent as she should about her home exercises - she would like to focus on this.  Tylenol, meloxicam only if needed.  Consider formal physical therapy, imaging of lumbar spine if not improving.  F/u in 6 weeks or prn.

## 2017-09-21 ENCOUNTER — Ambulatory Visit: Payer: BLUE CROSS/BLUE SHIELD | Admitting: Family

## 2017-09-21 ENCOUNTER — Encounter: Payer: Self-pay | Admitting: Family

## 2017-09-21 VITALS — BP 162/80 | HR 62 | Temp 98.7°F | Resp 16 | Ht 67.0 in | Wt 166.0 lb

## 2017-09-21 DIAGNOSIS — R350 Frequency of micturition: Secondary | ICD-10-CM

## 2017-09-21 DIAGNOSIS — R030 Elevated blood-pressure reading, without diagnosis of hypertension: Secondary | ICD-10-CM | POA: Diagnosis not present

## 2017-09-21 LAB — POC URINALSYSI DIPSTICK (AUTOMATED)
Bilirubin, UA: NEGATIVE
Blood, UA: NEGATIVE
Glucose, UA: NEGATIVE
Ketones, UA: NEGATIVE
Leukocytes, UA: NEGATIVE
Nitrite, UA: NEGATIVE
Protein, UA: NEGATIVE
Spec Grav, UA: 1.02 (ref 1.010–1.025)
Urobilinogen, UA: 0.2 E.U./dL
pH, UA: 6 (ref 5.0–8.0)

## 2017-09-21 MED ORDER — NITROFURANTOIN MONOHYD MACRO 100 MG PO CAPS: 100.0000 mg | ORAL_CAPSULE | Freq: Two times a day (BID) | ORAL | 0 refills | Status: DC

## 2017-09-21 NOTE — Progress Notes (Signed)
Subjective:    Patient ID: Sheryl Bryan, female    DOB: 22-Jun-1961, 57 y.o.   MRN: [id number]  HPI  Sheryl Bryan is a 57 yr old female who presents today with complaint of dysuria, urinary frequency, and urinary odor. Reports that she had to get up 5-6 times last night- normally goes 2 times a day. Symptoms started yesterday. She will be travelling Wednesday for work.    BP Readings from Last 3 Encounters:  09/21/17 (!) 162/80  09/15/17 (!) 144/87  08/04/17 126/87    Review of Systems See HPI  Past Medical History:  Diagnosis Date  . GERD (gastroesophageal reflux disease)    occ  . Papilloma of breast   . UTI (urinary tract infection)      Social History   Socioeconomic History  . Marital status: Married    Spouse name: Not on file  . Number of children: Not on file  . Years of education: Not on file  . Highest education level: Not on file  Social Needs  . Financial resource strain: Not on file  . Food insecurity - worry: Not on file  . Food insecurity - inability: Not on file  . Transportation needs - medical: Not on file  . Transportation needs - non-medical: Not on file  Occupational History  . Not on file  Tobacco Use  . Smoking status: Never Smoker  . Smokeless tobacco: Never Used  Substance and Sexual Activity  . Alcohol use: Yes    Comment: occasional  . Drug use: No  . Sexual activity: Not on file  Other Topics Concern  . Not on file  Social History Narrative  . Not on file    Past Surgical History:  Procedure Laterality Date  . BREAST LUMPECTOMY WITH RADIOACTIVE SEED LOCALIZATION Right 09/11/2016   Procedure: RIGHT BREAST LUMPECTOMY WITH RADIOACTIVE SEED LOCALIZATION;  Surgeon: Erroll Luna, MD;  Location: [place] [facility];  Service: General;  Laterality: Right;  . CESAREAN SECTION  12/04/93, 01/05/79  . ENDOMETRIAL ABLATION  2008  . FRACTURE SURGERY     rt pinkie toe  . PARTIAL MASTECTOMY WITH NEEDLE LOCALIZATION  09/01/2012   Procedure: PARTIAL  MASTECTOMY WITH NEEDLE LOCALIZATION;  Surgeon: Marcello Moores A. Cornett, MD;  Location: [facility];  Service: General;  Laterality: Right;    Family History  Problem Relation Age of Onset  . Cancer Mother        multiple myeloma  . Hypertension Mother   . Cancer Brother        leukemia  . Cancer Maternal Aunt        breast  . Cancer Maternal Aunt   . COPD Maternal Aunt        breast - great aunt    Allergies  Allergen Reactions  . Ciprofloxacin Nausea And Vomiting  . Sulfa Antibiotics Hives    Spreads all over the body    Current Outpatient Medications on File Prior to Visit  Medication Sig Dispense Refill  . ibuprofen (ADVIL,MOTRIN) 200 MG tablet Take 600 mg by mouth every 6 (six) hours as needed for headache or moderate pain.    Marland Kitchen levocetirizine (XYZAL) 5 MG tablet TAKE 1 TABLET BY MOUTH EVERY DAY IN THE EVENING 30 tablet 0  . meloxicam (MOBIC) 7.5 MG tablet Take 1 tablet (7.5 mg total) by mouth daily. 30 tablet 2  . valACYclovir (VALTREX) 500 MG tablet Take 1 tablet (500 mg total) by mouth 2 (two) times daily as needed. For outbreaks  30 tablet 2  . Vitamin D, Ergocalciferol, (DRISDOL) 50000 units CAPS capsule Take 1 capsule (50,000 Units total) by mouth every 7 (seven) days. 12 capsule 0   No current facility-administered medications on file prior to visit.     BP (!) 162/80   Pulse 62   Temp 98.7 F (37.1 C) (Oral)   Resp 16   Ht 5' 7"  (1.702 m)   Wt 166 lb (75.3 kg)   SpO2 100%   BMI 26.00 kg/m       Objective:   Physical Exam  Constitutional: She is oriented to person, place, and time. She appears well-developed and well-nourished.  Cardiovascular: Normal rate, regular rhythm and normal heart sounds.  No murmur heard. Pulmonary/Chest: Effort normal and breath sounds normal. No respiratory distress. She has no wheezes.  Abdominal: Soft.  Neurological: She is oriented to person, place, and time.  Psychiatric: She has a normal mood and affect. Her behavior is normal.  Judgment and thought content normal.          Assessment & Plan:  UTI- UA clear but symptoms suggestive of UTI. Will begin macrobid. Send urine for culture.  She is advised to call if symptoms worsen or fail to improve.   Elevated blood pressure- advised pt to follow up with PCP in 2 weeks for BP recheck. She is resistent to the idea of medications for bp.  Pt was counseled on long term health risks associated with untreated HTN.

## 2017-09-21 NOTE — Patient Instructions (Signed)
Please begin macrobid for likely urinary tract infection.  Continue to work on healthy, low sodium diet and exercise.

## 2017-09-30 ENCOUNTER — Other Ambulatory Visit (HOSPITAL_COMMUNITY)
Admission: RE | Admit: 2017-09-30 | Discharge: 2017-09-30 | Disposition: A | Discharge status: Home / Self Care | Payer: BLUE CROSS/BLUE SHIELD | Source: Ambulatory Visit | Attending: Family | Admitting: Family

## 2017-09-30 ENCOUNTER — Ambulatory Visit: Payer: BLUE CROSS/BLUE SHIELD | Admitting: Family

## 2017-09-30 ENCOUNTER — Encounter: Payer: Self-pay | Admitting: Family

## 2017-09-30 VITALS — BP 142/77 | HR 81 | Temp 98.4°F | Resp 16 | Ht 67.0 in | Wt 162.0 lb

## 2017-09-30 DIAGNOSIS — B9689 Other specified bacterial agents as the cause of diseases classified elsewhere: Secondary | ICD-10-CM | POA: Diagnosis not present

## 2017-09-30 DIAGNOSIS — R350 Frequency of micturition: Secondary | ICD-10-CM

## 2017-09-30 DIAGNOSIS — N898 Other specified noninflammatory disorders of vagina: Secondary | ICD-10-CM | POA: Diagnosis not present

## 2017-09-30 DIAGNOSIS — N760 Acute vaginitis: Secondary | ICD-10-CM | POA: Insufficient documentation

## 2017-09-30 LAB — POC URINALSYSI DIPSTICK (AUTOMATED)
Bilirubin, UA: NEGATIVE
Glucose, UA: NEGATIVE
Ketones, UA: NEGATIVE
Nitrite, UA: NEGATIVE
Protein, UA: NEGATIVE
Spec Grav, UA: 1.015 (ref 1.010–1.025)
Urobilinogen, UA: 0.2 E.U./dL
pH, UA: 6.5 (ref 5.0–8.0)

## 2017-09-30 NOTE — Patient Instructions (Signed)
We will contact you with your results and further recommendations.  °

## 2017-09-30 NOTE — Progress Notes (Signed)
Subjective:    Patient ID: Sheryl Bryan, female    DOB: [birth year], 56 y.o.   MRN: [id number]  HPI  Sheryl Bryan is a 58 yr old female who presents today with c/o constant pressure suprapubic. Has a "heavy" feeling in the vaginal area. Mild itching but has some vulvar burning.   She reports some discharge- greyish in color.   Review of Systems    see HPI  Past Medical History:  Diagnosis Date  . GERD (gastroesophageal reflux disease)    occ  . Papilloma of breast   . UTI (urinary tract infection)      Social History   Socioeconomic History  . Marital status: Married    Spouse name: Not on file  . Number of children: Not on file  . Years of education: Not on file  . Highest education level: Not on file  Social Needs  . Financial resource strain: Not on file  . Food insecurity - worry: Not on file  . Food insecurity - inability: Not on file  . Transportation needs - medical: Not on file  . Transportation needs - non-medical: Not on file  Occupational History  . Not on file  Tobacco Use  . Smoking status: Never Smoker  . Smokeless tobacco: Never Used  Substance and Sexual Activity  . Alcohol use: Yes    Comment: occasional  . Drug use: No  . Sexual activity: Not on file  Other Topics Concern  . Not on file  Social History Narrative  . Not on file    Past Surgical History:  Procedure Laterality Date  . BREAST LUMPECTOMY WITH RADIOACTIVE SEED LOCALIZATION Right 09/11/2016   Procedure: RIGHT BREAST LUMPECTOMY WITH RADIOACTIVE SEED LOCALIZATION;  Surgeon: Erroll Luna, MD;  Location: [facility];  Service: General;  Laterality: Right;  . CESAREAN SECTION  12/04/93, 01/05/79  . ENDOMETRIAL ABLATION  2008  . FRACTURE SURGERY     rt pinkie toe  . PARTIAL MASTECTOMY WITH NEEDLE LOCALIZATION  09/01/2012   Procedure: PARTIAL MASTECTOMY WITH NEEDLE LOCALIZATION;  Surgeon: Marcello Moores A. Cornett, MD;  Location: [facility];  Service: General;  Laterality: Right;    Family History    Problem Relation Age of Onset  . Cancer Mother        multiple myeloma  . Hypertension Mother   . Cancer Brother        leukemia  . Cancer Maternal Aunt        breast  . Cancer Maternal Aunt   . COPD Maternal Aunt        breast - great aunt    Allergies  Allergen Reactions  . Ciprofloxacin Nausea And Vomiting  . Sulfa Antibiotics Hives    Spreads all over the body    Current Outpatient Medications on File Prior to Visit  Medication Sig Dispense Refill  . ibuprofen (ADVIL,MOTRIN) 200 MG tablet Take 600 mg by mouth every 6 (six) hours as needed for headache or moderate pain.    Marland Kitchen levocetirizine (XYZAL) 5 MG tablet TAKE 1 TABLET BY MOUTH EVERY DAY IN THE EVENING 30 tablet 0  . meloxicam (MOBIC) 7.5 MG tablet Take 1 tablet (7.5 mg total) by mouth daily. 30 tablet 2  . valACYclovir (VALTREX) 500 MG tablet Take 1 tablet (500 mg total) by mouth 2 (two) times daily as needed. For outbreaks 30 tablet 2  . Vitamin D, Ergocalciferol, (DRISDOL) 50000 units CAPS capsule Take 1 capsule (50,000 Units total) by mouth every 7 (seven)  days. 12 capsule 0   No current facility-administered medications on file prior to visit.     BP (!) 142/77 (BP Location: Right Arm, Patient Position: Sitting, Cuff Size: Normal)   Pulse 81   Temp 98.4 F (36.9 C) (Oral)   Resp 16   Ht 5' 7"  (1.702 m)   Wt 162 lb (73.5 kg)   SpO2 100%   BMI 25.37 kg/m     Objective:   Physical Exam  Constitutional: She is oriented to person, place, and time. She appears well-developed and well-nourished.  Cardiovascular: Normal rate, regular rhythm and normal heart sounds.  No murmur heard. Pulmonary/Chest: Effort normal and breath sounds normal. No respiratory distress. She has no wheezes.  Genitourinary: Vagina normal. No vaginal discharge found.  Neurological: She is alert and oriented to person, place, and time.  Psychiatric: She has a normal mood and affect. Her behavior is normal. Judgment and thought content  normal.          Assessment & Plan:  Vaginitis- suspect BV.   Swab will be sent for yeast/gardnerella. Declines GC/Chlamydia testing. Further treatment pending results. UA unremarkable but will be sent for culture.  Vaginal dryness noted during exam.  If work up unremarkable, consider possibility of atrophic vaginitis.  Discussed addition of a probiotic and ensuring cotton underwear only.

## 2017-10-01 ENCOUNTER — Telehealth: Payer: Self-pay | Admitting: Family

## 2017-10-01 LAB — URINE CULTURE
MICRO NUMBER:: 90287858
SPECIMEN QUALITY:: ADEQUATE

## 2017-10-01 LAB — CERVICOVAGINAL ANCILLARY ONLY
Bacterial vaginitis: POSITIVE — AB
Candida vaginitis: NEGATIVE

## 2017-10-01 MED ORDER — METRONIDAZOLE 500 MG PO TABS: 500.0000 mg | ORAL_TABLET | Freq: Two times a day (BID) | ORAL | 0 refills | Status: AC

## 2017-10-01 NOTE — Telephone Encounter (Signed)
See my chart message

## 2017-10-05 NOTE — Progress Notes (Addendum)
[facility] [place], [place], [place] [address] [phone]  Date:  10/07/2017   Name:  Sheryl Bryan   DOB:  [birth year]   MRN:  [id number]  PCP:  Darreld Mclean, MD    Chief Complaint: Follow-up (Pt here for 2 week follow up visit. )   History of Present Illness:  Sheryl Bryan is a 57 y.o. very pleasant female patient who presents with the following:  Short term follow-up today-  She saw Melissa on 2/25 for an unrelated issue and was noted to have elevated BP At that time she did not wish to start on any medication for her BP She was in again last week for ?atropic vaginitis and noted to have BP reading as below  She is finishing up a course of flagyl today and her sx are much better. She is a bit concerned as she had BV twice this year.    BP Readings from Last 3 Encounters:  10/07/17 132/82  09/30/17 (!) 142/77  09/21/17 (!) 162/80   She has not had any BP issues in the past  10/18- BP 128/80 9/ 18  BP 118/68  Her mom, brothers, GM have HTN.   She is able to check her BP at home- her husband has HTN so she can use his cuff.   She plans to keep an eye on her BP at home and will alert me if she is running higher than 140/90   She also mentions her lower back pain and possible sx of sciatica today.  She notes pain in her RIGHT lower back that can radiate into her right thigh.  She had this in the past and was told that it was sciatica She is using meloxicam for her lower back pain per Dr. Barbaraann Barthel No bowel or bladder incont The right leg does not feel weak or numb  No x-rays of her lower back done as of yet   Lab Results  Component Value Date   HGBA1C 6.1 05/20/2017   History of pre-diabetes with most recent A1c as above Patient Active Problem List   Diagnosis Date Noted  . Low back pain 08/05/2017  . Pre-diabetes 05/21/2017  . Vitamin D deficiency 05/21/2017  . Insomnia 03/27/2016  .  Atypical ductal hyperplasia of right breast 08/22/2015  . Post-operative state 09/20/2012    Past Medical History:  Diagnosis Date  . GERD (gastroesophageal reflux disease)    occ  . Papilloma of breast   . UTI (urinary tract infection)     Past Surgical History:  Procedure Laterality Date  . BREAST LUMPECTOMY WITH RADIOACTIVE SEED LOCALIZATION Right 09/11/2016   Procedure: RIGHT BREAST LUMPECTOMY WITH RADIOACTIVE SEED LOCALIZATION;  Surgeon: Erroll Luna, MD;  Location: [facility];  Service: General;  Laterality: Right;  . CESAREAN SECTION  12/04/93, 01/05/79  . ENDOMETRIAL ABLATION  2008  . FRACTURE SURGERY     rt pinkie toe  . PARTIAL MASTECTOMY WITH NEEDLE LOCALIZATION  09/01/2012   Procedure: PARTIAL MASTECTOMY WITH NEEDLE LOCALIZATION;  Surgeon: Marcello Moores A. Cornett, MD;  Location: [facility];  Service: General;  Laterality: Right;    Social History   Tobacco Use  . Smoking status: Never Smoker  . Smokeless tobacco: Never Used  Substance Use Topics  . Alcohol use: Yes    Comment: occasional  . Drug use: No    Family History  Problem Relation Age of Onset  .  Cancer Mother        multiple myeloma  . Hypertension Mother   . Cancer Brother        leukemia  . Cancer Maternal Aunt        breast  . Cancer Maternal Aunt   . COPD Maternal Aunt        breast - great aunt    Allergies  Allergen Reactions  . Ciprofloxacin Nausea And Vomiting  . Sulfa Antibiotics Hives    Spreads all over the body    Medication list has been reviewed and updated.  Current Outpatient Medications on File Prior to Visit  Medication Sig Dispense Refill  . Cholecalciferol (VITAMIN D-3) 1000 units CAPS Take 1 capsule by mouth daily.    Marland Kitchen ibuprofen (ADVIL,MOTRIN) 200 MG tablet Take 600 mg by mouth every 6 (six) hours as needed for headache or moderate pain.    Marland Kitchen levocetirizine (XYZAL) 5 MG tablet TAKE 1 TABLET BY MOUTH EVERY DAY IN THE EVENING 30 tablet 0  . meloxicam (MOBIC) 7.5 MG tablet Take 1  tablet (7.5 mg total) by mouth daily. 30 tablet 2  . metroNIDAZOLE (FLAGYL) 500 MG tablet Take 1 tablet (500 mg total) by mouth 2 (two) times daily for 7 days. 14 tablet 0  . valACYclovir (VALTREX) 500 MG tablet Take 1 tablet (500 mg total) by mouth 2 (two) times daily as needed. For outbreaks 30 tablet 2   No current facility-administered medications on file prior to visit.     Review of Systems:  As per HPI- otherwise negative.   Physical Examination: Vitals:   10/07/17 0928  BP: 132/82  Pulse: 90  Temp: 98.1 F (36.7 C)  SpO2: 98%   Vitals:   10/07/17 0928  Weight: 164 lb 3.2 oz (74.5 kg)  Height: _0  (1.702 m)   Body mass index is 25.72 kg/m. Ideal Body Weight: Weight in (lb) to have BMI = 25: 159.3  GEN: WDWN, NAD, Non-toxic, A & O x 3, normal weight, looks well and younger than age 54: Atraumatic, Normocephalic. Neck supple. No masses, No LAD.  Bilateral TM wnl, oropharynx normal.  PEERL,EOMI.   Ears and Nose: No external deformity. CV: RRR, No M/G/R. No JVD. No thrill. No extra heart sounds. PULM: CTA B, no wheezes, crackles, rhonchi. No retractions. No resp. distress. No accessory muscle use. ABD: S, NT, ND EXTR: No c/c/e NEURO Normal gait.  PSYCH: Normally interactive. Conversant. Not depressed or anxious appearing.  Calm demeanor.  Normal strength, DTR of both LE.  Negative SLR.  She notes mild tenderness over the right SI joint/ sciatic nortch   Assessment and Plan: Elevated blood pressure reading  Sciatica of right side - Plan: predniSONE (DELTASONE) 20 MG tablet, DG Lumbar Spine Complete  She will monitor her BP at home, defer meds for now Obtain films of her lumbar spine today Will try a short course of prednisone for dx/treatment of possible sciatic nerve symptoms. She will let me know how this works for her    Signed Lamar Blinks, MD  Received her x-rays  Dg Lumbar Spine Complete  Result Date: 10/07/2017 CLINICAL DATA:  Several mos  of rt sided low back pain now started to radiate into rt leg. EXAM: LUMBAR SPINE - COMPLETE 4+ VIEW COMPARISON:  None FINDINGS: No fracture.  No spondylolisthesis. Mild loss of disc height with small endplate osteophytes and mild endplate sclerosis at G9-Q1. Remaining disc spaces are well preserved. Facet joints are relatively well preserved.  Soft tissues are unremarkable. IMPRESSION: 1. No fracture or acute finding. 2. Mild disc degenerative change at L5-S1. Electronically Signed   By: Lajean Manes M.D.   On: 10/07/2017 14:19   Message to pt- looks ok

## 2017-10-07 ENCOUNTER — Encounter: Payer: Self-pay | Admitting: Family Medicine

## 2017-10-07 ENCOUNTER — Ambulatory Visit: Payer: BLUE CROSS/BLUE SHIELD | Admitting: Family Medicine

## 2017-10-07 ENCOUNTER — Ambulatory Visit (HOSPITAL_BASED_OUTPATIENT_CLINIC_OR_DEPARTMENT_OTHER)
Admission: RE | Admit: 2017-10-07 | Discharge: 2017-10-07 | Disposition: A | Discharge status: Home / Self Care | Payer: BLUE CROSS/BLUE SHIELD | Source: Ambulatory Visit | Attending: Family Medicine | Admitting: Family Medicine

## 2017-10-07 VITALS — BP 132/82 | HR 90 | Temp 98.1°F | Ht 67.0 in | Wt 164.2 lb

## 2017-10-07 DIAGNOSIS — M5431 Sciatica, right side: Secondary | ICD-10-CM | POA: Diagnosis not present

## 2017-10-07 DIAGNOSIS — M545 Low back pain: Secondary | ICD-10-CM | POA: Diagnosis not present

## 2017-10-07 DIAGNOSIS — M5137 Other intervertebral disc degeneration, lumbosacral region: Secondary | ICD-10-CM | POA: Diagnosis not present

## 2017-10-07 DIAGNOSIS — R030 Elevated blood-pressure reading, without diagnosis of hypertension: Secondary | ICD-10-CM

## 2017-10-07 MED ORDER — PREDNISONE 20 MG PO TABS: ORAL_TABLET | ORAL | 0 refills | Status: DC

## 2017-10-07 NOTE — Patient Instructions (Addendum)
Please do keep an eye on your BP- if you start running higher than 140/90 on a regular basis please do alert me   We are going to try a course of prednisone for your right leg and lower back pain- take according to the directions No NSAIDs like meloxicam or aleve while on this medication!    Please let me know how it works for you

## 2018-01-20 ENCOUNTER — Encounter: Payer: Self-pay | Admitting: Family

## 2018-01-20 ENCOUNTER — Ambulatory Visit: Payer: BLUE CROSS/BLUE SHIELD | Admitting: Family

## 2018-01-20 VITALS — BP 130/78 | HR 70 | Temp 98.8°F | Resp 18 | Ht 67.0 in | Wt 162.4 lb

## 2018-01-20 DIAGNOSIS — M5416 Radiculopathy, lumbar region: Secondary | ICD-10-CM

## 2018-01-20 DIAGNOSIS — M545 Low back pain, unspecified: Secondary | ICD-10-CM

## 2018-01-20 DIAGNOSIS — G8929 Other chronic pain: Secondary | ICD-10-CM

## 2018-01-20 MED ORDER — MELOXICAM 7.5 MG PO TABS: 7.5000 mg | ORAL_TABLET | Freq: Every day | ORAL | 0 refills | Status: DC

## 2018-01-20 NOTE — Patient Instructions (Signed)
Please begin meloxicam. You should be contacted about your referral to physical therapy. Follow up if your symptoms worsen or if not improved in 4-6 weeks.

## 2018-01-20 NOTE — Progress Notes (Signed)
 Subjective:    Patient ID: Sheryl Bryan, female    DOB: 09/18/1960, 57 y.o.   MRN: [id number]  HPI  Patient is a 57 yr old female who presents today with chief complaint of right sided low back pain. This has been present x 5 months.  Pain radiates around to the left groin/upper thigh.   Did not like the way the prednisone made her feel.    Reports that last week her shoe got stuck on the carpet, fell forward, grabbed the railing. This jarred her back and she thinks that this exacerbated her back pain.   Pain is worst when she lays down at night.   Review of Systems   See HPI  Past Medical History:  Diagnosis Date  . GERD (gastroesophageal reflux disease)    occ  . Papilloma of breast   . UTI (urinary tract infection)      Social History   Socioeconomic History  . Marital status: Married    Spouse name: Not on file  . Number of children: Not on file  . Years of education: Not on file  . Highest education level: Not on file  Occupational History  . Not on file  Social Needs  . Financial resource strain: Not on file  . Food insecurity:    Worry: Not on file    Inability: Not on file  . Transportation needs:    Medical: Not on file    Non-medical: Not on file  Tobacco Use  . Smoking status: Never Smoker  . Smokeless tobacco: Never Used  Substance and Sexual Activity  . Alcohol use: Yes    Comment: occasional  . Drug use: No  . Sexual activity: Not on file  Lifestyle  . Physical activity:    Days per week: Not on file    Minutes per session: Not on file  . Stress: Not on file  Relationships  . Social connections:    Talks on phone: Not on file    Gets together: Not on file    Attends religious service: Not on file    Active member of club or organization: Not on file    Attends meetings of clubs or organizations: Not on file    Relationship status: Not on file  . Intimate partner violence:    Fear of current or ex partner: Not on file   Emotionally abused: Not on file    Physically abused: Not on file    Forced sexual activity: Not on file  Other Topics Concern  . Not on file  Social History Narrative  . Not on file    Past Surgical History:  Procedure Laterality Date  . BREAST LUMPECTOMY WITH RADIOACTIVE SEED LOCALIZATION Right 09/11/2016   Procedure: RIGHT BREAST LUMPECTOMY WITH RADIOACTIVE SEED LOCALIZATION;  Surgeon: Thomas Cornett, MD;  Location: [facility];  Service: General;  Laterality: Right;  . CESAREAN SECTION  12/04/93, 01/05/79  . ENDOMETRIAL ABLATION  2008  . FRACTURE SURGERY     rt pinkie toe  . PARTIAL MASTECTOMY WITH NEEDLE LOCALIZATION  09/01/2012   Procedure: PARTIAL MASTECTOMY WITH NEEDLE LOCALIZATION;  Surgeon: Thomas A. Cornett, MD;  Location: [facility];  Service: General;  Laterality: Right;    Family History  Problem Relation Age of Onset  . Cancer Mother        multiple myeloma  . Hypertension Mother   . Cancer Brother        leukemia  . Cancer Maternal Aunt          breast  . Cancer Maternal Aunt   . COPD Maternal Aunt        breast - great aunt    Allergies  Allergen Reactions  . Ciprofloxacin Nausea And Vomiting  . Sulfa Antibiotics Hives    Spreads all over the body    Current Outpatient Medications on File Prior to Visit  Medication Sig Dispense Refill  . Cholecalciferol (VITAMIN D-3) 1000 units CAPS Take 1 capsule by mouth daily.    . ibuprofen (ADVIL,MOTRIN) 200 MG tablet Take 600 mg by mouth every 6 (six) hours as needed for headache or moderate pain.    . valACYclovir (VALTREX) 500 MG tablet Take 1 tablet (500 mg total) by mouth 2 (two) times daily as needed. For outbreaks 30 tablet 2   No current facility-administered medications on file prior to visit.     BP 130/78 (BP Location: Right Arm, Cuff Size: Normal)   Pulse 70   Temp 98.8 F (37.1 C) (Oral)   Resp 18   Ht 5' 7" (1.702 m)   Wt 162 lb 6.4 oz (73.7 kg)   SpO2 99%   BMI 25.44 kg/m        Objective:    Physical Exam  Constitutional: She appears well-developed and well-nourished.  Cardiovascular: Normal rate, regular rhythm and normal heart sounds.  No murmur heard. Pulmonary/Chest: Effort normal and breath sounds normal. No respiratory distress. She has no wheezes.  Musculoskeletal:       Cervical back: She exhibits no tenderness.       Thoracic back: She exhibits no tenderness.       Lumbar back: She exhibits no tenderness.  Neurological:  Reflex Scores:      Patellar reflexes are 2+ on the right side and 2+ on the left side. Bilateral LE strength is 5/5  Psychiatric: She has a normal mood and affect. Her behavior is normal. Judgment and thought content normal.          Assessment & Plan:  Lumbar radiculopathy- rx with meloxicam, refer to PT. If symptoms worsen or if not improved in 4-6 weeks will plan to obtain MRI. Pt is agreeable to plan.  

## 2018-01-26 ENCOUNTER — Ambulatory Visit: Payer: BLUE CROSS/BLUE SHIELD | Admitting: Physical Therapy

## 2018-02-16 ENCOUNTER — Encounter (INDEPENDENT_AMBULATORY_CARE_PROVIDER_SITE_OTHER): Payer: Self-pay

## 2018-02-16 ENCOUNTER — Other Ambulatory Visit: Payer: Self-pay | Admitting: Family

## 2018-04-12 ENCOUNTER — Other Ambulatory Visit (HOSPITAL_COMMUNITY)
Admission: RE | Admit: 2018-04-12 | Discharge: 2018-04-12 | Disposition: A | Discharge status: Home / Self Care | Payer: BLUE CROSS/BLUE SHIELD | Source: Ambulatory Visit | Attending: Family | Admitting: Family

## 2018-04-12 ENCOUNTER — Ambulatory Visit: Payer: BLUE CROSS/BLUE SHIELD | Admitting: Family

## 2018-04-12 ENCOUNTER — Encounter: Payer: Self-pay | Admitting: Family

## 2018-04-12 VITALS — BP 126/76 | HR 71 | Temp 98.8°F | Resp 16 | Ht 67.0 in | Wt 162.0 lb

## 2018-04-12 DIAGNOSIS — N72 Inflammatory disease of cervix uteri: Secondary | ICD-10-CM

## 2018-04-12 DIAGNOSIS — N898 Other specified noninflammatory disorders of vagina: Secondary | ICD-10-CM | POA: Diagnosis not present

## 2018-04-12 DIAGNOSIS — N760 Acute vaginitis: Secondary | ICD-10-CM | POA: Insufficient documentation

## 2018-04-12 DIAGNOSIS — B9689 Other specified bacterial agents as the cause of diseases classified elsewhere: Secondary | ICD-10-CM | POA: Diagnosis not present

## 2018-04-12 DIAGNOSIS — R0789 Other chest pain: Secondary | ICD-10-CM | POA: Diagnosis not present

## 2018-04-12 DIAGNOSIS — R079 Chest pain, unspecified: Secondary | ICD-10-CM | POA: Diagnosis not present

## 2018-04-12 DIAGNOSIS — R350 Frequency of micturition: Secondary | ICD-10-CM | POA: Insufficient documentation

## 2018-04-12 MED ORDER — CEFTRIAXONE SODIUM 250 MG IJ SOLR
250.0000 mg | Freq: Once | INTRAMUSCULAR | Status: AC
  Administered 2018-04-12: 250 mg via INTRAMUSCULAR

## 2018-04-12 MED ORDER — AZITHROMYCIN 1 G PO PACK: 1.0000 g | PACK | Freq: Once | ORAL | Status: DC

## 2018-04-12 MED ORDER — AZITHROMYCIN 250 MG PO TABS
1000.0000 mg | ORAL_TABLET | Freq: Every day | ORAL | Status: AC
  Administered 2018-04-12: 1000 mg via ORAL

## 2018-04-12 NOTE — Patient Instructions (Signed)
You should be contacted about your referral to cardiology. Please go to the ER if you develop severe chest pain. We will contact you with your results.

## 2018-04-12 NOTE — Progress Notes (Signed)
Subjective:    Patient ID: Sheryl Bryan, female    DOB: [birth year], 57 y.o.   MRN: [id number]  HPI  Pt is a 57 yr old female who presents with c/o vaginal irritation. Notes that symptoms began about 2 days ago. Dyspareunia during intercourse. Some discharge, no odor or fever.    She also reports intermittent left sided chest pain.  She describes it as a brief pinching on the left anterior chest which lasts just a few seconds.  This can occur at rest or with activity.  Review of Systems    see HPI  Past Medical History:  Diagnosis Date  . GERD (gastroesophageal reflux disease)    occ  . Papilloma of breast   . UTI (urinary tract infection)      Social History   Socioeconomic History  . Marital status: Married    Spouse name: Not on file  . Number of children: Not on file  . Years of education: Not on file  . Highest education level: Not on file  Occupational History  . Not on file  Social Needs  . Financial resource strain: Not on file  . Food insecurity:    Worry: Not on file    Inability: Not on file  . Transportation needs:    Medical: Not on file    Non-medical: Not on file  Tobacco Use  . Smoking status: Never Smoker  . Smokeless tobacco: Never Used  Substance and Sexual Activity  . Alcohol use: Yes    Comment: occasional  . Drug use: No  . Sexual activity: Not on file  Lifestyle  . Physical activity:    Days per week: Not on file    Minutes per session: Not on file  . Stress: Not on file  Relationships  . Social connections:    Talks on phone: Not on file    Gets together: Not on file    Attends religious service: Not on file    Active member of club or organization: Not on file    Attends meetings of clubs or organizations: Not on file    Relationship status: Not on file  . Intimate partner violence:    Fear of current or ex partner: Not on file    Emotionally abused: Not on file    Physically abused: Not on file    Forced sexual activity:  Not on file  Other Topics Concern  . Not on file  Social History Narrative  . Not on file    Past Surgical History:  Procedure Laterality Date  . BREAST LUMPECTOMY WITH RADIOACTIVE SEED LOCALIZATION Right 09/11/2016   Procedure: RIGHT BREAST LUMPECTOMY WITH RADIOACTIVE SEED LOCALIZATION;  Surgeon: Erroll Luna, MD;  Location: [facility];  Service: General;  Laterality: Right;  . CESAREAN SECTION  12/04/93, 01/05/79  . ENDOMETRIAL ABLATION  2008  . FRACTURE SURGERY     rt pinkie toe  . PARTIAL MASTECTOMY WITH NEEDLE LOCALIZATION  09/01/2012   Procedure: PARTIAL MASTECTOMY WITH NEEDLE LOCALIZATION;  Surgeon: Marcello Moores A. Cornett, MD;  Location: [facility];  Service: General;  Laterality: Right;    Family History  Problem Relation Age of Onset  . Cancer Mother        multiple myeloma  . Hypertension Mother   . Cancer Brother        leukemia  . Cancer Maternal Aunt        breast  . Cancer Maternal Aunt   . COPD Maternal Aunt  breast - great aunt    Allergies  Allergen Reactions  . Ciprofloxacin Nausea And Vomiting  . Sulfa Antibiotics Hives    Spreads all over the body    Current Outpatient Medications on File Prior to Visit  Medication Sig Dispense Refill  . Cholecalciferol (VITAMIN D-3) 1000 units CAPS Take 1 capsule by mouth daily.    . meloxicam (MOBIC) 7.5 MG tablet TAKE 1 TABLET(7.5 MG) BY MOUTH DAILY 30 tablet 0  . valACYclovir (VALTREX) 500 MG tablet Take 1 tablet (500 mg total) by mouth 2 (two) times daily as needed. For outbreaks 30 tablet 2  . ibuprofen (ADVIL,MOTRIN) 200 MG tablet Take 600 mg by mouth every 6 (six) hours as needed for headache or moderate pain.     No current facility-administered medications on file prior to visit.     BP 126/76 (BP Location: Left Arm, Patient Position: Sitting, Cuff Size: Small)   Pulse 71   Temp 98.8 F (37.1 C) (Oral)   Resp 16   Ht _0  (1.702 m)   Wt 162 lb (73.5 kg)   SpO2 98%   BMI 25.37 kg/m    Objective:    Physical Exam  Constitutional: She appears well-developed and well-nourished.  Cardiovascular: Normal rate, regular rhythm and normal heart sounds.  No murmur heard. Pulmonary/Chest: Effort normal and breath sounds normal. No respiratory distress. She has no wheezes.  Genitourinary:  Genitourinary Comments: Mild  CMT, discomfort with speculum exam + thick yellow vaginal discharge noted.   Psychiatric: She has a normal mood and affect. Her behavior is normal. Judgment and thought content normal.          Assessment & Plan:  Cervicitis- will rx empirically with IM cephtriaxone and PO azithromycin.  Wet prep sent for BV/yeast/trich, gc/chlamydia. Further recommendations following review of these results.   Atypical chest pain- EKG tracing is personally reviewed.  EKG notes NSR.  No acute changes. Will refer to cardiology for further testing.  She understands that she should go to the ER if she develops severe chest pain.

## 2018-04-14 LAB — CERVICOVAGINAL ANCILLARY ONLY
Bacterial vaginitis: NEGATIVE
Candida vaginitis: NEGATIVE
Chlamydia: NEGATIVE
Neisseria Gonorrhea: NEGATIVE
Trichomonas: NEGATIVE

## 2018-04-28 ENCOUNTER — Ambulatory Visit (INDEPENDENT_AMBULATORY_CARE_PROVIDER_SITE_OTHER): Payer: BLUE CROSS/BLUE SHIELD | Admitting: Cardiology

## 2018-04-28 ENCOUNTER — Encounter: Payer: Self-pay | Admitting: Cardiology

## 2018-04-28 ENCOUNTER — Ambulatory Visit: Payer: BLUE CROSS/BLUE SHIELD | Admitting: Cardiology

## 2018-04-28 VITALS — BP 128/74 | HR 75 | Ht 67.0 in | Wt 162.0 lb

## 2018-04-28 DIAGNOSIS — Z7189 Other specified counseling: Secondary | ICD-10-CM

## 2018-04-28 DIAGNOSIS — R072 Precordial pain: Secondary | ICD-10-CM

## 2018-04-28 DIAGNOSIS — R079 Chest pain, unspecified: Secondary | ICD-10-CM | POA: Diagnosis not present

## 2018-04-28 NOTE — Progress Notes (Signed)
Cardiology Office Note:    Date:  04/29/2018   ID:  Sheryl Bryan, DOB 02/08/1961, MRN [id number]  PCP:  Sheryl Mclean, MD  Cardiologist:  Sheryl Dresser, MD PhD  Referring MD: Sheryl Alar, NP   CC: pinching chest pain  History of Present Illness:    Sheryl Bryan is a 57 y.o. female with a hx of breast ductal hyperplasia who is seen as a new consult at the request of Sheryl Alar, NP for the evaluation and management of intermittent left sided chest pain. She was seen for this on 04/12/18.  Chest pain: -Initial onset: a month or two ago -Quality: pinching, felt like she breathes a bit heavier when she gets it -Frequency: was feeling every day/every other day, not felt in a week or two -Duration: very brief (30 sec-1 min) -Triggers: not related to exertion, position, activity, food -Aggravating/alleviating factors: better if she rests for a few minutes, nothing clearing makes it worse. -Prior cardiac history: none -Prior ECG: normal sinus rhythm -Prior workup: none -Prior treatment: none -Alcohol: occasional; at most 4 glasses wine/week -Tobacco: never -Comorbidities: none -Exercise level: had been exercising several days/week, walks routinely -Labs: TSH , kidney function/electrolytes , CBC . -Cardiac ROS: no shortness of breath, no PND, no orthopnea, no LE edema, no syncope -Family history: maternal Gpa and Gma both had heart issues, unsure exactly what. Gma had stent. Aunt passed from cancer, also had COPD.  Past Medical History:  Diagnosis Date  . GERD (gastroesophageal reflux disease)    occ  . Papilloma of breast   . UTI (urinary tract infection)     Past Surgical History:  Procedure Laterality Date  . BREAST LUMPECTOMY WITH RADIOACTIVE SEED LOCALIZATION Right 09/11/2016   Procedure: RIGHT BREAST LUMPECTOMY WITH RADIOACTIVE SEED LOCALIZATION;  Surgeon: Sheryl Luna, MD;  Location: [facility];  Service: General;  Laterality: Right;  .  CESAREAN SECTION  12/04/93, 01/05/79  . ENDOMETRIAL ABLATION  2008  . FRACTURE SURGERY     rt pinkie toe  . PARTIAL MASTECTOMY WITH NEEDLE LOCALIZATION  09/01/2012   Procedure: PARTIAL MASTECTOMY WITH NEEDLE LOCALIZATION;  Surgeon: Sheryl Moores A. Cornett, MD;  Location: [facility];  Service: General;  Laterality: Right;    Current Medications: Current Outpatient Medications on File Prior to Visit  Medication Sig  . Cholecalciferol (VITAMIN D-3) 1000 units CAPS Take 1 capsule by mouth daily.  Marland Kitchen ibuprofen (ADVIL,MOTRIN) 200 MG tablet Take 600 mg by mouth every 6 (six) hours as needed for headache or moderate pain.  . meloxicam (MOBIC) 7.5 MG tablet TAKE 1 TABLET(7.5 MG) BY MOUTH DAILY (Patient taking differently: Take 7.5 mg by mouth as needed. )  . valACYclovir (VALTREX) 500 MG tablet Take 1 tablet (500 mg total) by mouth 2 (two) times daily as needed. For outbreaks   No current facility-administered medications on file prior to visit.      Allergies:   Ciprofloxacin and Sulfa antibiotics   Social History   Socioeconomic History  . Marital status: Married    Spouse name: Not on file  . Number of children: Not on file  . Years of education: Not on file  . Highest education level: Not on file  Occupational History  . Not on file  Social Needs  . Financial resource strain: Not on file  . Food insecurity:    Worry: Not on file    Inability: Not on file  . Transportation needs:    Medical: Not on file  Non-medical: Not on file  Tobacco Use  . Smoking status: Never Smoker  . Smokeless tobacco: Never Used  Substance and Sexual Activity  . Alcohol use: Yes    Comment: occasional  . Drug use: No  . Sexual activity: Not on file  Lifestyle  . Physical activity:    Days per week: Not on file    Minutes per session: Not on file  . Stress: Not on file  Relationships  . Social connections:    Talks on phone: Not on file    Gets together: Not on file    Attends religious service: Not on  file    Active member of club or organization: Not on file    Attends meetings of clubs or organizations: Not on file    Relationship status: Not on file  Other Topics Concern  . Not on file  Social History Narrative  . Not on file     Family History: The patient's family history includes COPD in her maternal aunt; Cancer in her brother, maternal aunt, maternal aunt, and mother; Hypertension in her mother.  ROS:   Please see the history of present illness.  Additional pertinent ROS:  Constitutional: Negative for chills, fever, night sweats, unintentional weight loss. Has some hot flashes especially at night.  HENT: Negative for ear pain and hearing loss.   Eyes: Negative for loss of vision and eye pain.  Respiratory: Negative for cough, sputum, shortness of breath, wheezing.   Cardiovascular: Positive for chest pain as above. Negative for palpitations , PND, orthopnea, lower extremity edema and claudication.  Gastrointestinal: Negative for abdominal pain, melena, and hematochezia.  Genitourinary: Negative for dysuria and hematuria.  Musculoskeletal: Negative for falls and myalgias.  Skin: Negative for itching and rash.  Neurological: Negative for focal weakness, focal sensory changes and loss of consciousness.  Endo/Heme/Allergies: Does not bruise/bleed easily.    EKGs/Labs/Other Studies Reviewed:    The following studies were reviewed today: Office notes No prior cardiac workup  EKG:  EKG is ordered today.  The ekg ordered today demonstrates normal sinus rhythm.  Recent Labs: 05/20/2017: ALT 8; BUN 12; Creatinine, Ser 0.84; Hemoglobin 12.6; Platelets 209.0; Potassium 4.8; Sodium 143  Recent Lipid Panel    Component Value Date/Time   CHOL 173 05/20/2017 1437   TRIG 92.0 05/20/2017 1437   HDL 57.30 05/20/2017 1437   CHOLHDL 3 05/20/2017 1437   VLDL 18.4 05/20/2017 1437   LDLCALC 97 05/20/2017 1437    Physical Exam:    VS:  BP 128/74   Pulse 75   Ht 5\' 7"  (1.702 m)    Wt 162 lb (73.5 kg)   BMI 25.37 kg/m     Wt Readings from Last 3 Encounters:  04/28/18 162 lb (73.5 kg)  04/12/18 162 lb (73.5 kg)  01/20/18 162 lb 6.4 oz (73.7 kg)     GEN: Well nourished, well developed in no acute distress HEENT: Normal NECK: No JVD; No carotid bruits LYMPHATICS: No lymphadenopathy CARDIAC: regular rhythm, normal S1 and S2, no murmurs, rubs, gallops. Radial and DP pulses 2+ bilaterally. RESPIRATORY:  Clear to auscultation without rales, wheezing or rhonchi  ABDOMEN: Soft, non-tender, non-distended MUSCULOSKELETAL:  No edema; No deformity  SKIN: Warm and dry NEUROLOGIC:  Alert and oriented x 3 PSYCHIATRIC:  Normal affect   ASSESSMENT:    1. Precordial pain   2. Counseling on health promotion and disease prevention   3. Chest pain, unspecified type    PLAN:  1. Chest pain: has atypical features, but is new for her, and it is associated with shortness of breath. Based on her ECG and her lack of additional factors, she is a candidate to a treadmill exercise test. We discussed the test at length, including the possibility of false positive based on her risk category. She understands the chance of this. If her test is normal, she plans to treat this as non-cardiac chest pain. However, if it is abnormal, she would like to discuss the options before pursuing additional confirmatory testing.  -exercise treadmill test ordered  2. Prevention The 10-year ASCVD risk score Mikey Bussing DC Jr., et al., 2013) is: 2.5%   Values used to calculate the score:     Age: 67 years     Sex: Female     Is Non-Hispanic African American: Yes     Diabetic: No     Tobacco smoker: No     Systolic Blood Pressure: 096 mmHg     Is BP treated: No     HDL Cholesterol: 57.3 mg/dL     Total Cholesterol: 173 mg/dL  Prevention: -recommend heart healthy/Mediterranean diet, with whole grains, fruits, vegetable, fish, lean meats, nuts, and olive oil. Limit salt. -recommend moderate walking, 3-5  times/week for 30-50 minutes each session. Aim for at least 150 minutes.week. Goal should be pace of 3 miles/hours, or walking 1.5 miles in 30 minutes -recommend avoidance of tobacco products. Avoid excess alcohol. -Additional risk factor control:  -Diabetes: A1c is 6.1  -Lipids: LDL 97, HDL 57, Tchol 173, TG 92  -Blood pressure control: at goal on no antihypertensives  -Weight: BMI 25, at goal  Plan for follow up: TBD based on results of testing. If testing normal, offered annual follow up for prevention.  Medication Adjustments/Labs and Tests Ordered: Current medicines are reviewed at length with the patient today.  Concerns regarding medicines are outlined above.  Orders Placed This Encounter  Procedures  . Exercise Tolerance Test  . EKG 12-Lead   No orders of the defined types were placed in this encounter.   Patient Instructions  Medication Instructions: Your physician recommends that you continue on your current medications as directed.    If you need a refill on your cardiac medications before your next appointment, please call your pharmacy.   Labwork: None  Procedures/Testing: Your physician has requested that you have an exercise tolerance test. For further information please visit [email or web address]. Please also follow instruction sheet, as given.    Follow-Up: Your physician wants you to follow-up as needed with Dr. Harrell Gave. Special Instructions:    Thank you for choosing [facility] at [facility]!!       Signed, Sheryl Dresser, MD PhD 04/29/2018 8:14 AM    [place]

## 2018-04-28 NOTE — Patient Instructions (Signed)
Medication Instructions: Your physician recommends that you continue on your current medications as directed.    If you need a refill on your cardiac medications before your next appointment, please call your pharmacy.   Labwork: None  Procedures/Testing: Your physician has requested that you have an exercise tolerance test. For further information please visit [email or web address]. Please also follow instruction sheet, as given.    Follow-Up: Your physician wants you to follow-up as needed with Dr. Harrell Gave. Special Instructions:    Thank you for choosing [facility] at [facility]!!

## 2018-04-29 ENCOUNTER — Encounter: Payer: Self-pay | Admitting: Cardiology

## 2018-05-12 ENCOUNTER — Ambulatory Visit (INDEPENDENT_AMBULATORY_CARE_PROVIDER_SITE_OTHER): Payer: BLUE CROSS/BLUE SHIELD

## 2018-05-12 DIAGNOSIS — R079 Chest pain, unspecified: Secondary | ICD-10-CM

## 2018-05-12 LAB — EXERCISE TOLERANCE TEST
Estimated workload: 10.1 METS
Exercise duration (min): 9 min
Exercise duration (sec): 0 s
MPHR: 165 {beats}/min
Peak HR: 148 {beats}/min
Percent HR: 89 %
RPE: 16
Rest HR: 90 {beats}/min

## 2018-05-17 ENCOUNTER — Telehealth: Payer: Self-pay | Admitting: Cardiology

## 2018-05-17 NOTE — Telephone Encounter (Signed)
Informed patient of GXT results; informed to call the office with any further questions or concerns.

## 2018-05-17 NOTE — Telephone Encounter (Signed)
New message ° ° ° °Pt is returning call about results. °

## 2018-05-19 DIAGNOSIS — N3090 Cystitis, unspecified without hematuria: Secondary | ICD-10-CM | POA: Diagnosis not present

## 2018-05-19 DIAGNOSIS — N3001 Acute cystitis with hematuria: Secondary | ICD-10-CM | POA: Diagnosis not present

## 2018-06-01 NOTE — Progress Notes (Addendum)
[facility] [address], [place], [place] [address] [phone]  Date:  06/03/2018   Name:  Sheryl Bryan   DOB:  [birth year]   MRN:  [id number]  PCP:  Darreld Mclean, MD    Chief Complaint: Annual Exam (bladder infection last week-discharge)   History of Present Illness:  Sheryl Bryan is a 57 y.o. very pleasant female patient who presents with the following:  Here today for a CPE History of pre-diabetes, insomnia, vitamin D def Last seen here for a physical a year ago: GYN care- Dr. Garwin Brothers, will see next month for pap and mammo Colon cancer screening: colonoscopy 2015 Her follow-up breast imaging has been ok Her seasonal allergies have not been that bad She is going to the gym 3x a week- she is running for exercise and enjoying it  She and her husband plan to do a 5k in the spring  She does use valtrex for rare HSV outbreaks- she is generally able to catch these at the start and minimize her sx. Does need a refill to have on hand  Mammo/ pap: per Dr. Garwin Brothers, and they do her mammo as well  Colon:2015 Labs: a year ago- she is not fasting at the moment  Immun: flu is done   Her family is doing well. Her daughter is due with a son- her 3rd baby- in the spring  She was treated for a UTI at [facility] last week- she was on one abx and then changed to augmentin She has noted thick white vaginal discharge the last few days.  No itching She notes some urinary frequency - we will re-urinefor her today  She saw cardiology last month for some atypical chest pain but her stress was ok She had noted CP a couple of times and Melissa had her see cardiology after their visit in February  She seems to be having more frequent UTI and vaginal sx recently.  Menopause x4- 5 years Melissa did STI screening and wet prep in September which were negative  Sx may be due to vaginal atrophy- suggested that she ask Dr. Garwin Brothers about  doing a vaginal estrogen cream at her appt there in a couple of months   She also notes right lower back pain that we discussed early this year, I had her see Dr. Barbaraann Barthel but she did not follow-up with him or do PT as he had suggested She notes that the pain will radiate into her right hip as well, but does not go down her leg or into her foot No numbness or weakness of the leg No bowel or bladder control change    Patient Active Problem List   Diagnosis Date Noted  . Low back pain 08/05/2017  . Pre-diabetes 05/21/2017  . Vitamin D deficiency 05/21/2017  . Insomnia 03/27/2016  . Atypical ductal hyperplasia of right breast 08/22/2015  . Post-operative state 09/20/2012    Past Medical History:  Diagnosis Date  . GERD (gastroesophageal reflux disease)    occ  . Papilloma of breast   . UTI (urinary tract infection)     Past Surgical History:  Procedure Laterality Date  . BREAST LUMPECTOMY WITH RADIOACTIVE SEED LOCALIZATION Right 09/11/2016   Procedure: RIGHT BREAST LUMPECTOMY WITH RADIOACTIVE SEED LOCALIZATION;  Surgeon: Erroll Luna, MD;  Location: [facility];  Service: General;  Laterality: Right;  . CESAREAN SECTION  12/04/93, 01/05/79  . ENDOMETRIAL ABLATION  2008  .  FRACTURE SURGERY     rt pinkie toe  . PARTIAL MASTECTOMY WITH NEEDLE LOCALIZATION  09/01/2012   Procedure: PARTIAL MASTECTOMY WITH NEEDLE LOCALIZATION;  Surgeon: Marcello Moores A. Cornett, MD;  Location: [facility];  Service: General;  Laterality: Right;    Social History   Tobacco Use  . Smoking status: Never Smoker  . Smokeless tobacco: Never Used  Substance Use Topics  . Alcohol use: Yes    Comment: occasional  . Drug use: No    Family History  Problem Relation Age of Onset  . Cancer Mother        multiple myeloma  . Hypertension Mother   . Cancer Brother        leukemia  . Cancer Maternal Aunt        breast  . Cancer Maternal Aunt   . COPD Maternal Aunt        breast - great aunt    Allergies  Allergen  Reactions  . Ciprofloxacin Nausea And Vomiting  . Sulfa Antibiotics Hives    Spreads all over the body    Medication list has been reviewed and updated.  Current Outpatient Medications on File Prior to Visit  Medication Sig Dispense Refill  . Cholecalciferol (VITAMIN D-3) 1000 units CAPS Take 1 capsule by mouth daily.    Marland Kitchen ibuprofen (ADVIL,MOTRIN) 200 MG tablet Take 600 mg by mouth every 6 (six) hours as needed for headache or moderate pain.    . meloxicam (MOBIC) 7.5 MG tablet TAKE 1 TABLET(7.5 MG) BY MOUTH DAILY (Patient taking differently: Take 7.5 mg by mouth as needed. ) 30 tablet 0   No current facility-administered medications on file prior to visit.     Review of Systems:  As per HPI- otherwise negative. No fever or chills    Physical Examination: Vitals:   06/03/18 0917 06/03/18 0940  BP: 118/72   Pulse: (!) 102 96  Resp: 16   Temp: 98.2 F (36.8 C)   SpO2: 98%    Vitals:   06/03/18 0917  Weight: 161 lb 12.8 oz (73.4 kg)  Height: 5' 7"  (1.702 m)   Body mass index is 25.34 kg/m. Ideal Body Weight: Weight in (lb) to have BMI = 25: 159.3  GEN: WDWN, NAD, Non-toxic, A & O x 3, tall build, looks well  HEENT: Atraumatic, Normocephalic. Neck supple. No masses, No LAD. Bilateral TM wnl, oropharynx normal.  PEERL,EOMI.   Ears and Nose: No external deformity. CV: RRR, No M/G/R. No JVD. No thrill. No extra heart sounds. PULM: CTA B, no wheezes, crackles, rhonchi. No retractions. No resp. distress. No accessory muscle use. ABD: S, NT, ND EXTR: No c/c/e NEURO Normal gait.  PSYCH: Normally interactive. Conversant. Not depressed or anxious appearing.  Calm demeanor.  Right lower back and hip- she has muscular tenderness in the right lower back, which may radiate into the hip joint. Normal ROM of the hip Normal strength and sensation, negative SLR both LE   Dg Hip Unilat W Or W/o Pelvis 2-3 Views Right  Result Date: 06/03/2018 CLINICAL DATA:  Right hip pain for 6  months.  Low back pain. EXAM: DG HIP (WITH OR WITHOUT PELVIS) 2-3V RIGHT COMPARISON:  None. FINDINGS: Preserved articular spaces in the hips. No significant arthropathy. No appreciable bony abnormality. IMPRESSION: Negative. Electronically Signed   By: Van Clines M.D.   On: 06/03/2018 10:28     Assessment and Plan: Physical exam  Screening for diabetes mellitus - Plan: Comprehensive metabolic panel, Hemoglobin  A1c  Screening for deficiency anemia - Plan: CBC  Screening for hyperlipidemia - Plan: Lipid panel  History of UTI - Plan: Urine Culture  HSV (herpes simplex virus) infection - Plan: valACYclovir (VALTREX) 500 MG tablet  Vaginal itching - Plan: fluconazole (DIFLUCAN) 150 MG tablet  Right hip pain - Plan: DG HIP UNILAT W OR W/O PELVIS 2-3 VIEWS RIGHT  Strain of lumbar region, subsequent encounter - Plan: Ambulatory referral to Physical Therapy  CPE today BP looks fine today Obtain routine labs as above Will use diflucan for presumed monilia following recent abx use Hip vs lower back pain.  Will get hip film- if negative plan referral to PT for lower back pain     Signed Lamar Blinks, MD   Received her labs, message to pt  Results for orders placed or performed in visit on 06/03/18  Urine Culture  Result Value Ref Range   MICRO NUMBER: [id number]    SPECIMEN QUALITY: ADEQUATE    Sample Source NOT GIVEN    STATUS: FINAL    Result: No Growth   CBC  Result Value Ref Range   WBC 3.9 (L) 4.0 - 10.5 K/uL   RBC 4.87 3.87 - 5.11 Mil/uL   Platelets 212.0 150.0 - 400.0 K/uL   Hemoglobin 12.8 12.0 - 15.0 g/dL   HCT 39.5 36.0 - 46.0 %   MCV 81.1 78.0 - 100.0 fl   MCHC 32.5 30.0 - 36.0 g/dL   RDW 14.6 11.5 - 15.5 %  Comprehensive metabolic panel  Result Value Ref Range   Sodium 142 135 - 145 mEq/L   Potassium 3.8 3.5 - 5.1 mEq/L   Chloride 106 96 - 112 mEq/L   CO2 31 19 - 32 mEq/L   Glucose, Bld 83 70 - 99 mg/dL   BUN 12 6 - 23 mg/dL   Creatinine, Ser  0.77 0.40 - 1.20 mg/dL   Total Bilirubin 0.4 0.2 - 1.2 mg/dL   Alkaline Phosphatase 54 39 - 117 U/L   AST 15 0 - 37 U/L   ALT 10 0 - 35 U/L   Total Protein 6.3 6.0 - 8.3 g/dL   Albumin 4.4 3.5 - 5.2 g/dL   Calcium 9.2 8.4 - 10.5 mg/dL   GFR 100.06 >60.00 mL/min  Hemoglobin A1c  Result Value Ref Range   Hgb A1c MFr Bld 6.0 4.6 - 6.5 %  Lipid panel  Result Value Ref Range   Cholesterol 184 0 - 200 mg/dL   Triglycerides 73.0 0.0 - 149.0 mg/dL   HDL 59.50 >39.00 mg/dL   VLDL 14.6 0.0 - 40.0 mg/dL   LDL Cholesterol 110 (H) 0 - 99 mg/dL   Total CHOL/HDL Ratio 3    NonHDL 124.12    Dg Hip Unilat W Or W/o Pelvis 2-3 Views Right  Result Date: 06/03/2018 CLINICAL DATA:  Right hip pain for 6 months.  Low back pain. EXAM: DG HIP (WITH OR WITHOUT PELVIS) 2-3V RIGHT COMPARISON:  None. FINDINGS: Preserved articular spaces in the hips. No significant arthropathy. No appreciable bony abnormality. IMPRESSION: Negative. Electronically Signed   By: Van Clines M.D.   On: 06/03/2018 10:28   11/9- received her urine culture, negative

## 2018-06-03 ENCOUNTER — Encounter: Payer: Self-pay | Admitting: Family Medicine

## 2018-06-03 ENCOUNTER — Ambulatory Visit (HOSPITAL_BASED_OUTPATIENT_CLINIC_OR_DEPARTMENT_OTHER)
Admission: RE | Admit: 2018-06-03 | Discharge: 2018-06-03 | Disposition: A | Discharge status: Home / Self Care | Payer: BLUE CROSS/BLUE SHIELD | Source: Ambulatory Visit | Attending: Family Medicine | Admitting: Family Medicine

## 2018-06-03 ENCOUNTER — Ambulatory Visit (INDEPENDENT_AMBULATORY_CARE_PROVIDER_SITE_OTHER): Payer: BLUE CROSS/BLUE SHIELD | Admitting: Family Medicine

## 2018-06-03 VITALS — BP 118/72 | HR 96 | Temp 98.2°F | Resp 16 | Ht 67.0 in | Wt 161.8 lb

## 2018-06-03 DIAGNOSIS — M25551 Pain in right hip: Secondary | ICD-10-CM

## 2018-06-03 DIAGNOSIS — B009 Herpesviral infection, unspecified: Secondary | ICD-10-CM

## 2018-06-03 DIAGNOSIS — Z130 Encounter for screening for diseases of the blood and blood-forming organs and certain disorders involving the immune mechanism: Secondary | ICD-10-CM | POA: Diagnosis not present

## 2018-06-03 DIAGNOSIS — Z0000 Encounter for general adult medical examination without abnormal findings: Secondary | ICD-10-CM

## 2018-06-03 DIAGNOSIS — Z13220 Encounter for screening for lipoid disorders: Secondary | ICD-10-CM

## 2018-06-03 DIAGNOSIS — Z87440 Personal history of urinary (tract) infections: Secondary | ICD-10-CM

## 2018-06-03 DIAGNOSIS — Z131 Encounter for screening for diabetes mellitus: Secondary | ICD-10-CM

## 2018-06-03 DIAGNOSIS — S39012D Strain of muscle, fascia and tendon of lower back, subsequent encounter: Secondary | ICD-10-CM

## 2018-06-03 DIAGNOSIS — N898 Other specified noninflammatory disorders of vagina: Secondary | ICD-10-CM

## 2018-06-03 LAB — COMPREHENSIVE METABOLIC PANEL
ALT: 10 U/L (ref 0–35)
AST: 15 U/L (ref 0–37)
Albumin: 4.4 g/dL (ref 3.5–5.2)
Alkaline Phosphatase: 54 U/L (ref 39–117)
BUN: 12 mg/dL (ref 6–23)
CO2: 31 mEq/L (ref 19–32)
Calcium: 9.2 mg/dL (ref 8.4–10.5)
Chloride: 106 mEq/L (ref 96–112)
Creatinine, Ser: 0.77 mg/dL (ref 0.40–1.20)
GFR: 100.06 mL/min (ref >60.00)
Glucose, Bld: 83 mg/dL (ref 70–99)
Potassium: 3.8 mEq/L (ref 3.5–5.1)
Sodium: 142 mEq/L (ref 135–145)
Total Bilirubin: 0.4 mg/dL (ref 0.2–1.2)
Total Protein: 6.3 g/dL (ref 6.0–8.3)

## 2018-06-03 LAB — LIPID PANEL
Cholesterol: 184 mg/dL (ref 0–200)
HDL: 59.5 mg/dL (ref >39.00)
LDL Cholesterol: 110 mg/dL — ABNORMAL HIGH (ref 0–99)
NonHDL: 124.12
Total CHOL/HDL Ratio: 3
Triglycerides: 73 mg/dL (ref 0.0–149.0)
VLDL: 14.6 mg/dL (ref 0.0–40.0)

## 2018-06-03 LAB — CBC
HCT: 39.5 % (ref 36.0–46.0)
Hemoglobin: 12.8 g/dL (ref 12.0–15.0)
MCHC: 32.5 g/dL (ref 30.0–36.0)
MCV: 81.1 fl (ref 78.0–100.0)
Platelets: 212 10*3/uL (ref 150.0–400.0)
RBC: 4.87 Mil/uL (ref 3.87–5.11)
RDW: 14.6 % (ref 11.5–15.5)
WBC: 3.9 10*3/uL — ABNORMAL LOW (ref 4.0–10.5)

## 2018-06-03 LAB — HEMOGLOBIN A1C: Hgb A1c MFr Bld: 6 % (ref 4.6–6.5)

## 2018-06-03 MED ORDER — VALACYCLOVIR HCL 500 MG PO TABS: 500.0000 mg | ORAL_TABLET | Freq: Two times a day (BID) | ORAL | 2 refills | Status: DC | PRN

## 2018-06-03 MED ORDER — FLUCONAZOLE 150 MG PO TABS: 150.0000 mg | ORAL_TABLET | Freq: Once | ORAL | 0 refills | Status: AC

## 2018-06-03 NOTE — Patient Instructions (Signed)
Good to see you today!  I will be in touch with your labs asap Please go to lab, and then to the ground floor imaging dept for right hip x-ray I am going to treat you for a possible yeast infection with diflucan pill once, then repeat in a week if you continue to have any concerns about discharge.  When you see Dr. Cousins, mention that you might want to try a vaginal estrogen cream for dryness.   If your hip looks ok I will refer you to physical therapy!    Health Maintenance for Postmenopausal Women Menopause is a normal process in which your reproductive ability comes to an end. This process happens gradually over a span of months to years, usually between the ages of 48 and 55. Menopause is complete when you have missed 12 consecutive menstrual periods. It is important to talk with your health care provider about some of the most common conditions that affect postmenopausal women, such as heart disease, cancer, and bone loss (osteoporosis). Adopting a healthy lifestyle and getting preventive care can help to promote your health and wellness. Those actions can also lower your chances of developing some of these common conditions. What should I know about menopause? During menopause, you may experience a number of symptoms, such as:  Moderate-to-severe hot flashes.  Night sweats.  Decrease in sex drive.  Mood swings.  Headaches.  Tiredness.  Irritability.  Memory problems.  Insomnia.  Choosing to treat or not to treat menopausal changes is an individual decision that you make with your health care provider. What should I know about hormone replacement therapy and supplements? Hormone therapy products are effective for treating symptoms that are associated with menopause, such as hot flashes and night sweats. Hormone replacement carries certain risks, especially as you become older. If you are thinking about using estrogen or estrogen with progestin treatments, discuss the benefits  and risks with your health care provider. What should I know about heart disease and stroke? Heart disease, heart attack, and stroke become more likely as you age. This may be due, in part, to the hormonal changes that your body experiences during menopause. These can affect how your body processes dietary fats, triglycerides, and cholesterol. Heart attack and stroke are both medical emergencies. There are many things that you can do to help prevent heart disease and stroke:  Have your blood pressure checked at least every 1-2 years. High blood pressure causes heart disease and increases the risk of stroke.  If you are 55-79 years old, ask your health care provider if you should take aspirin to prevent a heart attack or a stroke.  Do not use any tobacco products, including cigarettes, chewing tobacco, or electronic cigarettes. If you need help quitting, ask your health care provider.  It is important to eat a healthy diet and maintain a healthy weight. ? Be sure to include plenty of vegetables, fruits, low-fat dairy products, and lean protein. ? Avoid eating foods that are high in solid fats, added sugars, or salt (sodium).  Get regular exercise. This is one of the most important things that you can do for your health. ? Try to exercise for at least 150 minutes each week. The type of exercise that you do should increase your heart rate and make you sweat. This is known as moderate-intensity exercise. ? Try to do strengthening exercises at least twice each week. Do these in addition to the moderate-intensity exercise.  Know your numbers.Ask your health care provider   to check your cholesterol and your blood glucose. Continue to have your blood tested as directed by your health care provider.  What should I know about cancer screening? There are several types of cancer. Take the following steps to reduce your risk and to catch any cancer development as early as possible. Breast  Cancer  Practice breast self-awareness. ? This means understanding how your breasts normally appear and feel. ? It also means doing regular breast self-exams. Let your health care provider know about any changes, no matter how small.  If you are 47 or older, have a clinician do a breast exam (clinical breast exam or CBE) every year. Depending on your age, family history, and medical history, it may be recommended that you also have a yearly breast X-ray (mammogram).  If you have a family history of breast cancer, talk with your health care provider about genetic screening.  If you are at high risk for breast cancer, talk with your health care provider about having an MRI and a mammogram every year.  Breast cancer (BRCA) gene test is recommended for women who have family members with BRCA-related cancers. Results of the assessment will determine the need for genetic counseling and BRCA1 and for BRCA2 testing. BRCA-related cancers include these types: ? Breast. This occurs in males or females. ? Ovarian. ? Tubal. This may also be called fallopian tube cancer. ? Cancer of the abdominal or pelvic lining (peritoneal cancer). ? Prostate. ? Pancreatic.  Cervical, Uterine, and Ovarian Cancer Your health care provider may recommend that you be screened regularly for cancer of the pelvic organs. These include your ovaries, uterus, and vagina. This screening involves a pelvic exam, which includes checking for microscopic changes to the surface of your cervix (Pap test).  For women ages 21-65, health care providers may recommend a pelvic exam and a Pap test every three years. For women ages 69-65, they may recommend the Pap test and pelvic exam, combined with testing for human papilloma virus (HPV), every five years. Some types of HPV increase your risk of cervical cancer. Testing for HPV may also be done on women of any age who have unclear Pap test results.  Other health care providers may not  recommend any screening for nonpregnant women who are considered low risk for pelvic cancer and have no symptoms. Ask your health care provider if a screening pelvic exam is right for you.  If you have had past treatment for cervical cancer or a condition that could lead to cancer, you need Pap tests and screening for cancer for at least 20 years after your treatment. If Pap tests have been discontinued for you, your risk factors (such as having a new sexual partner) need to be reassessed to determine if you should start having screenings again. Some women have medical problems that increase the chance of getting cervical cancer. In these cases, your health care provider may recommend that you have screening and Pap tests more often.  If you have a family history of uterine cancer or ovarian cancer, talk with your health care provider about genetic screening.  If you have vaginal bleeding after reaching menopause, tell your health care provider.  There are currently no reliable tests available to screen for ovarian cancer.  Lung Cancer Lung cancer screening is recommended for adults 28-43 years old who are at high risk for lung cancer because of a history of smoking. A yearly low-dose CT scan of the lungs is recommended if you:  Currently  smoke.  Have a history of at least 30 pack-years of smoking and you currently smoke or have quit within the past 15 years. A pack-year is smoking an average of one pack of cigarettes per day for one year.  Yearly screening should:  Continue until it has been 15 years since you quit.  Stop if you develop a health problem that would prevent you from having lung cancer treatment.  Colorectal Cancer  This type of cancer can be detected and can often be prevented.  Routine colorectal cancer screening usually begins at age 50 and continues through age 75.  If you have risk factors for colon cancer, your health care provider may recommend that you be screened  at an earlier age.  If you have a family history of colorectal cancer, talk with your health care provider about genetic screening.  Your health care provider may also recommend using home test kits to check for hidden blood in your stool.  A small camera at the end of a tube can be used to examine your colon directly (sigmoidoscopy or colonoscopy). This is done to check for the earliest forms of colorectal cancer.  Direct examination of the colon should be repeated every 5-10 years until age 75. However, if early forms of precancerous polyps or small growths are found or if you have a family history or genetic risk for colorectal cancer, you may need to be screened more often.  Skin Cancer  Check your skin from head to toe regularly.  Monitor any moles. Be sure to tell your health care provider: ? About any new moles or changes in moles, especially if there is a change in a mole's shape or color. ? If you have a mole that is larger than the size of a pencil eraser.  If any of your family members has a history of skin cancer, especially at a young age, talk with your health care provider about genetic screening.  Always use sunscreen. Apply sunscreen liberally and repeatedly throughout the day.  Whenever you are outside, protect yourself by wearing long sleeves, pants, a wide-brimmed hat, and sunglasses.  What should I know about osteoporosis? Osteoporosis is a condition in which bone destruction happens more quickly than new bone creation. After menopause, you may be at an increased risk for osteoporosis. To help prevent osteoporosis or the bone fractures that can happen because of osteoporosis, the following is recommended:  If you are 19-50 years old, get at least 1,000 mg of calcium and at least 600 mg of vitamin D per day.  If you are older than age 50 but younger than age 70, get at least 1,200 mg of calcium and at least 600 mg of vitamin D per day.  If you are older than age 70,  get at least 1,200 mg of calcium and at least 800 mg of vitamin D per day.  Smoking and excessive alcohol intake increase the risk of osteoporosis. Eat foods that are rich in calcium and vitamin D, and do weight-bearing exercises several times each week as directed by your health care provider. What should I know about how menopause affects my mental health? Depression may occur at any age, but it is more common as you become older. Common symptoms of depression include:  Low or sad mood.  Changes in sleep patterns.  Changes in appetite or eating patterns.  Feeling an overall lack of motivation or enjoyment of activities that you previously enjoyed.  Frequent crying spells.  Talk   with your health care provider if you think that you are experiencing depression. What should I know about immunizations? It is important that you get and maintain your immunizations. These include:  Tetanus, diphtheria, and pertussis (Tdap) booster vaccine.  Influenza every year before the flu season begins.  Pneumonia vaccine.  Shingles vaccine.  Your health care provider may also recommend other immunizations. This information is not intended to replace advice given to you by your health care provider. Make sure you discuss any questions you have with your health care provider. Document Released: 09/05/2005 Document Revised: 02/01/2016 Document Reviewed: 04/17/2015 Elsevier Interactive Patient Education  2018 [employer].

## 2018-06-04 LAB — URINE CULTURE
MICRO NUMBER:: 91342416
Result:: NO GROWTH
SPECIMEN QUALITY:: ADEQUATE

## 2018-06-05 ENCOUNTER — Encounter: Payer: Self-pay | Admitting: Family Medicine

## 2018-06-17 ENCOUNTER — Encounter: Payer: Self-pay | Admitting: Physical Therapy

## 2018-06-17 ENCOUNTER — Ambulatory Visit: Payer: BLUE CROSS/BLUE SHIELD | Attending: Family Medicine | Admitting: Physical Therapy

## 2018-06-17 DIAGNOSIS — G8929 Other chronic pain: Secondary | ICD-10-CM | POA: Insufficient documentation

## 2018-06-17 DIAGNOSIS — M545 Low back pain, unspecified: Secondary | ICD-10-CM

## 2018-06-17 DIAGNOSIS — M25551 Pain in right hip: Secondary | ICD-10-CM

## 2018-06-17 NOTE — Patient Instructions (Addendum)
Access Code: [email or web address]  URL: [email or web address]  Date: 06/17/2018  Prepared by: Elsie Ra   Exercises  Supine Piriformis Stretch - 3 reps - 1 sets - 30 hold - 2x daily - 6x weekly  Supine Hamstring Stretch with Strap - 3 reps - 1 sets - 30 hold - 2x daily - 6x weekly  Standing Lumbar Extension - 10 reps - 1-2 sets - 5 hold - 2x daily - 6x weekly  Supine Bridge - 10 reps - 1-3 sets - 2x daily - 6x weekly  Static Prone on Elbows - 3 sets - 30 to 60 sec hold - 2x daily - 6x weekly  Seated Sciatic Tensioner - 10 reps - 1 sets - 3 hold - 2x daily - 6x weekly   TENS UNIT: This is helpful for muscle pain and spasm.   Search and Purchase a TENS 7000 2nd edition at www.tenspros.com. It should be less than $30.     TENS unit instructions: Do not shower or bathe with the unit on Turn the unit off before removing electrodes or batteries If the electrodes lose stickiness add a drop of water to the electrodes after they are disconnected from the unit and place on plastic sheet. If you continued to have difficulty, call the TENS unit company to purchase more electrodes. Do not apply lotion on the skin area prior to use. Make sure the skin is clean and dry as this will help prolong the life of the electrodes. After use, always check skin for unusual red areas, rash or other skin difficulties. If there are any skin problems, does not apply electrodes to the same area. Never remove the electrodes from the unit by pulling the wires. Do not use the TENS unit or electrodes other than as directed. Do not change electrode placement without consultating your therapist or physician. Keep 2 fingers with between each electrode. Wear time ratio is 2:1, on to off times.    For example on for 30 minutes off for 15 minutes and then on for 30 minutes off for 15 minutes

## 2018-06-17 NOTE — Therapy (Signed)
[facility] [address]  [place], [place], [address] Phone: [phone]   Fax:  [phone]  Physical Therapy Evaluation  Patient Details  Name: Sheryl Bryan MRN: [id number] Date of Birth: 1961/01/26 Referring Provider (PT): Silvestre Mesi, MD   Encounter Date: 06/17/2018  PT End of Session - 06/17/18 0929    Visit Number  1    Number of Visits  12    Date for PT Re-Evaluation  07/29/18    Authorization Type  BCBS    PT Start Time  0805    PT Stop Time  0852    PT Time Calculation (min)  47 min    Activity Tolerance  Patient tolerated treatment well    Behavior During Therapy  [facility] for tasks assessed/performed       Past Medical History:  Diagnosis Date  . GERD (gastroesophageal reflux disease)    occ  . Papilloma of breast   . UTI (urinary tract infection)     Past Surgical History:  Procedure Laterality Date  . BREAST LUMPECTOMY WITH RADIOACTIVE SEED LOCALIZATION Right 09/11/2016   Procedure: RIGHT BREAST LUMPECTOMY WITH RADIOACTIVE SEED LOCALIZATION;  Surgeon: Erroll Luna, MD;  Location: [facility];  Service: General;  Laterality: Right;  . CESAREAN SECTION  12/04/93, 01/05/79  . ENDOMETRIAL ABLATION  2008  . FRACTURE SURGERY     rt pinkie toe  . PARTIAL MASTECTOMY WITH NEEDLE LOCALIZATION  09/01/2012   Procedure: PARTIAL MASTECTOMY WITH NEEDLE LOCALIZATION;  Surgeon: Marcello Moores A. Cornett, MD;  Location: [facility];  Service: General;  Laterality: Right;    There were no vitals filed for this visit.   Subjective Assessment - 06/17/18 0901    Subjective  Pt relays Rt sided LBP and pain into her posterior hip with insideous onset 5-6 months ago. She relays pain is worse with prolonged sitting at work, with driving, and at night with trying to lay down flat on her back or on her Rt side. She denies any previous back injuries or N/T into her Rt leg.     Pertinent History  PMH: Chronic Rt LBP and hip  pain,partial masectomy,GERD    Limitations  Sitting;House hold activities    How long can you sit comfortably?  depends    How long can you stand comfortably?  no limit    How long can you walk comfortably?  mile    Diagnostic tests  Lumbar x-ray "Mild disc degenerative change at L5-S1, nothing acute" . neg hip x-ray    Patient Stated Goals  get the pain down    Currently in Pain?  Yes    Pain Score  7     Pain Location  Back    Pain Orientation  Right    Pain Descriptors / Indicators  Dull;Aching    Pain Type  Chronic pain    Pain Radiating Towards  Rt posterior hip    Pain Onset  More than a month ago    Pain Frequency  Intermittent    Aggravating Factors   sitting, driving, sleeping, leg press and ab machine at gym (she was recommended to avoid these for now)    Pain Relieving Factors  temp relief with ice, has not tried heat, meds do not help    Multiple Pain Sites  No         [facility] PT Assessment - 06/17/18 0001      Assessment   Medical Diagnosis  Rt  sided LBP/strain, Rt SI jt pain/DDD    Referring Provider (PT)  Silvestre Mesi, MD    Onset Date/Surgical Date  --   6 month onset of pain   Next MD Visit  --   6 months   Prior Therapy  none      Precautions   Precautions  None      Restrictions   Weight Bearing Restrictions  No      Balance Screen   Has the patient fallen in the past 6 months  No      [place] residence      Prior Function   Level of Independence  Independent    Vocation  Full time employment    Vocation Requirements  HR, office work    Leisure  gym      Cognition   Overall Cognitive Status  Within Functional Limits for tasks assessed      Observation/Other Assessments   Focus on Therapeutic Outcomes (FOTO)   42% limited      Sensation   Light Touch  Appears Intact      ROM / Strength   AROM / PROM / Strength  AROM;Strength      AROM   Overall AROM Comments  lumbar AROM 75% available with  pulling on Rt with all planes, Rt hip IR limited to 35 deg      Strength   Overall Strength Comments  LE strength WNL 5/5 tested in sitting grossly bilat      Palpation   Spinal mobility  good mobility but pain L3-5 with PAM    Palpation comment  pain over SI joint and T.P in glutes/piriiformis and QL on Rt      Special Tests   Other special tests  +slump test and FABERS on Rt, neg SLR and quadrant testing bilat      Ambulation/Gait   Gait Comments  WFL                Objective measurements completed on examination: See above findings.      [facility] PT Treatment/Exercise - 06/17/18 0001      Self-Care   Self-Care  --   HEP,heat/ice,tennis ball massage     Modalities   Modalities  Electrical Stimulation;Moist Heat      Moist Heat Therapy   Number Minutes Moist Heat  15 Minutes    Moist Heat Location  Lumbar Spine      Electrical Stimulation   Electrical Stimulation Location  Rt lumbar/glutes    Electrical Stimulation Action  IFC    Electrical Stimulation Parameters  tolerance, pt sitting    Electrical Stimulation Goals  Pain;Tone             PT Education - 06/17/18 0926    Education Details  HEP,POC,heat/ice,tennis ball T.P. release,TENS    Person(s) Educated  Patient    Methods  Explanation    Comprehension  Verbalized understanding;Need further instruction          PT Long Term Goals - 06/17/18 1009      PT LONG TERM GOAL #1   Title  Pt will be I and compliant with HEP. 07/29/18    Time  6    Period  Weeks    Status  New      PT LONG TERM GOAL #2   Title  Pt will decrease pain in back/hip to less than 2-3/10 overall with ususal activity. 07/29/18  Time  6    Period  Weeks    Status  New      PT LONG TERM GOAL #3   Title  Pt will improve FOTO to less than 32% limited. 07/29/18    Time  6    Period  Weeks    Status  New      PT LONG TERM GOAL #4   Title  Pt will have no Triggerpoints noted in Rt glutes/piriformis. 07/29/18    Time   6    Period  Weeks    Status  New             Plan - 06/17/18 0935    Clinical Impression Statement  Pt presents with Rt sided LBP,strain, and SI joint pain. She had neg x-rays for hip but did have some mild DDD L5-S1. She has increased tightntess and T.P in Rt lumbar P.S,QL,piriformis and glutes likely contributing to pain. She has good LE strength but has some ROM limiitations, and increased pain with prolonged sitting, driving, of laying down. She will benefit from skilled PT to address her deficits. She may benefit from DN to reduce her trigger points.     Clinical Presentation  Evolving    Clinical Presentation due to:  worsening pain over last 6 months    Clinical Decision Making  Moderate    Rehab Potential  Good    PT Frequency  2x / week   1-2   PT Duration  6 weeks    PT Treatment/Interventions  Cryotherapy;Electrical Stimulation;Iontophoresis 4mg /ml Dexamethasone;Moist Heat;Ultrasound;Traction;Therapeutic activities;Therapeutic exercise;Neuromuscular re-education;Passive range of motion;Dry needling;Taping;Spinal Manipulations;Joint Manipulations    PT Next Visit Plan  HEP reivew, glute stretching and strengthening, consider MT,modalties, DN    Consulted and Agree with Plan of Care  Patient       Patient will benefit from skilled therapeutic intervention in order to improve the following deficits and impairments:  Decreased activity tolerance, Decreased endurance, Decreased range of motion, Decreased strength, Increased fascial restricitons, Increased muscle spasms, Impaired flexibility, Pain  Visit Diagnosis: Chronic right-sided low back pain, unspecified whether sciatica present  Pain in right hip     Problem List Patient Active Problem List   Diagnosis Date Noted  . Low back pain 08/05/2017  . Pre-diabetes 05/21/2017  . Vitamin D deficiency 05/21/2017  . Insomnia 03/27/2016  . Atypical ductal hyperplasia of right breast 08/22/2015  . Post-operative state  09/20/2012    Debbe Odea, PT,DPT 06/17/2018, 11:16 AM  [facility] [address]  [place], [place], [address] Phone: [phone]   Fax:  [phone]  Name: Sheryl Bryan MRN: [id number] Date of Birth: 13-Mar-1961

## 2018-07-06 ENCOUNTER — Ambulatory Visit: Payer: BLUE CROSS/BLUE SHIELD | Admitting: Family Medicine

## 2018-07-06 ENCOUNTER — Emergency Department (HOSPITAL_COMMUNITY): Payer: BLUE CROSS/BLUE SHIELD

## 2018-07-06 ENCOUNTER — Encounter (HOSPITAL_COMMUNITY): Payer: Self-pay | Admitting: Internal Medicine

## 2018-07-06 ENCOUNTER — Observation Stay (HOSPITAL_COMMUNITY)
Admission: EM | Admit: 2018-07-06 | Discharge: 2018-07-07 | Disposition: A | Discharge status: Home / Self Care | Payer: BLUE CROSS/BLUE SHIELD | Attending: Internal Medicine | Admitting: Internal Medicine

## 2018-07-06 ENCOUNTER — Ambulatory Visit: Payer: Self-pay

## 2018-07-06 DIAGNOSIS — I10 Essential (primary) hypertension: Secondary | ICD-10-CM | POA: Diagnosis not present

## 2018-07-06 DIAGNOSIS — R000 Tachycardia, unspecified: Secondary | ICD-10-CM | POA: Diagnosis not present

## 2018-07-06 DIAGNOSIS — R42 Dizziness and giddiness: Principal | ICD-10-CM

## 2018-07-06 DIAGNOSIS — Z791 Long term (current) use of non-steroidal anti-inflammatories (NSAID): Secondary | ICD-10-CM | POA: Insufficient documentation

## 2018-07-06 DIAGNOSIS — Z881 Allergy status to other antibiotic agents status: Secondary | ICD-10-CM | POA: Diagnosis not present

## 2018-07-06 DIAGNOSIS — Z79899 Other long term (current) drug therapy: Secondary | ICD-10-CM | POA: Diagnosis not present

## 2018-07-06 DIAGNOSIS — Z882 Allergy status to sulfonamides status: Secondary | ICD-10-CM | POA: Diagnosis not present

## 2018-07-06 DIAGNOSIS — R1111 Vomiting without nausea: Secondary | ICD-10-CM | POA: Diagnosis not present

## 2018-07-06 DIAGNOSIS — R112 Nausea with vomiting, unspecified: Secondary | ICD-10-CM | POA: Diagnosis not present

## 2018-07-06 DIAGNOSIS — I6521 Occlusion and stenosis of right carotid artery: Secondary | ICD-10-CM | POA: Diagnosis not present

## 2018-07-06 LAB — COMPREHENSIVE METABOLIC PANEL
ALT: 14 U/L (ref 0–44)
AST: 20 U/L (ref 15–41)
Albumin: 3.6 g/dL (ref 3.5–5.0)
Alkaline Phosphatase: 46 U/L (ref 38–126)
Anion gap: 9 (ref 5–15)
BUN: 10 mg/dL (ref 6–20)
CO2: 24 mmol/L (ref 22–32)
Calcium: 8.5 mg/dL — ABNORMAL LOW (ref 8.9–10.3)
Chloride: 109 mmol/L (ref 98–111)
Creatinine, Ser: 0.76 mg/dL (ref 0.44–1.00)
GFR calc Af Amer: >60 mL/min (ref >60)
GFR calc non Af Amer: >60 mL/min (ref >60)
Glucose, Bld: 141 mg/dL — ABNORMAL HIGH (ref 70–99)
Potassium: 3.4 mmol/L — ABNORMAL LOW (ref 3.5–5.1)
Sodium: 142 mmol/L (ref 135–145)
Total Bilirubin: 0.7 mg/dL (ref 0.3–1.2)
Total Protein: 6 g/dL — ABNORMAL LOW (ref 6.5–8.1)

## 2018-07-06 LAB — CBC
HCT: 40.9 % (ref 36.0–46.0)
Hemoglobin: 12.6 g/dL (ref 12.0–15.0)
MCH: 25.7 pg — ABNORMAL LOW (ref 26.0–34.0)
MCHC: 30.8 g/dL (ref 30.0–36.0)
MCV: 83.5 fL (ref 80.0–100.0)
Platelets: 203 10*3/uL (ref 150–400)
RBC: 4.9 MIL/uL (ref 3.87–5.11)
RDW: 14.1 % (ref 11.5–15.5)
WBC: 6.7 10*3/uL (ref 4.0–10.5)
nRBC: 0 % (ref 0.0–0.2)

## 2018-07-06 LAB — I-STAT BETA HCG BLOOD, ED (MC, WL, AP ONLY): I-stat hCG, quantitative: <5 m[IU]/mL (ref <5)

## 2018-07-06 LAB — LIPASE, BLOOD: Lipase: 24 U/L (ref 11–51)

## 2018-07-06 MED ORDER — LORAZEPAM 2 MG/ML IJ SOLN: 1.0000 mg | INTRAMUSCULAR | Status: DC | PRN

## 2018-07-06 MED ORDER — LACTATED RINGERS IV SOLN
INTRAVENOUS | Status: AC
  Administered 2018-07-07: 1000 mL via INTRAVENOUS

## 2018-07-06 MED ORDER — DIAZEPAM 2 MG PO TABS
2.0000 mg | ORAL_TABLET | Freq: Once | ORAL | Status: AC
  Administered 2018-07-06: 2 mg via ORAL
  Filled 2018-07-06: qty 1

## 2018-07-06 MED ORDER — LACTATED RINGERS IV BOLUS
1000.0000 mL | Freq: Once | INTRAVENOUS | Status: AC
  Administered 2018-07-06: 1000 mL via INTRAVENOUS

## 2018-07-06 MED ORDER — ONDANSETRON 4 MG PO TBDP
4.0000 mg | ORAL_TABLET | Freq: Four times a day (QID) | ORAL | Status: DC
  Administered 2018-07-07 (×3): 4 mg via ORAL
  Filled 2018-07-06 (×3): qty 1

## 2018-07-06 MED ORDER — PROCHLORPERAZINE EDISYLATE 10 MG/2ML IJ SOLN
10.0000 mg | Freq: Once | INTRAMUSCULAR | Status: AC
  Administered 2018-07-06: 10 mg via INTRAVENOUS
  Filled 2018-07-06: qty 2

## 2018-07-06 MED ORDER — PREDNISONE 20 MG PO TABS
60.0000 mg | ORAL_TABLET | Freq: Every day | ORAL | Status: DC
  Administered 2018-07-07: 60 mg via ORAL
  Filled 2018-07-06: qty 3

## 2018-07-06 MED ORDER — PROCHLORPERAZINE EDISYLATE 10 MG/2ML IJ SOLN
10.0000 mg | Freq: Four times a day (QID) | INTRAMUSCULAR | Status: DC | PRN
  Administered 2018-07-07: 10 mg via INTRAVENOUS
  Filled 2018-07-06: qty 2

## 2018-07-06 MED ORDER — ONDANSETRON HCL 4 MG/2ML IJ SOLN
4.0000 mg | Freq: Once | INTRAMUSCULAR | Status: AC
  Administered 2018-07-06: 4 mg via INTRAVENOUS
  Filled 2018-07-06: qty 2

## 2018-07-06 MED ORDER — IOPAMIDOL (ISOVUE-370) INJECTION 76%
100.0000 mL | Freq: Once | INTRAVENOUS | Status: AC | PRN
  Administered 2018-07-06: 100 mL via INTRAVENOUS

## 2018-07-06 MED ORDER — POTASSIUM CHLORIDE 10 MEQ/100ML IV SOLN
10.0000 meq | INTRAVENOUS | Status: AC
  Administered 2018-07-07 (×2): 10 meq via INTRAVENOUS
  Filled 2018-07-06 (×2): qty 100

## 2018-07-06 MED ORDER — ENOXAPARIN SODIUM 40 MG/0.4ML ~~LOC~~ SOLN
40.0000 mg | Freq: Every day | SUBCUTANEOUS | Status: DC
  Administered 2018-07-07: 40 mg via SUBCUTANEOUS
  Filled 2018-07-06: qty 0.4

## 2018-07-06 MED ORDER — SODIUM CHLORIDE 0.9% FLUSH
3.0000 mL | Freq: Two times a day (BID) | INTRAVENOUS | Status: DC
  Administered 2018-07-07 (×2): 3 mL via INTRAVENOUS

## 2018-07-06 MED ORDER — IOPAMIDOL (ISOVUE-370) INJECTION 76%
INTRAVENOUS | Status: AC
  Filled 2018-07-06: qty 100

## 2018-07-06 NOTE — ED Provider Notes (Signed)
[facility] EMERGENCY DEPARTMENT Provider Note   CSN: [id number] Arrival date & time: 07/06/18  1212     History   Chief Complaint Chief Complaint  Patient presents with  . Dizziness  . Emesis    HPI Sheryl Bryan is a 57 y.o. female.  The history is provided by the patient.  Dizziness  Quality:  Vertigo and room spinning Severity:  Severe Onset quality:  Sudden Duration:  3 days Timing:  Intermittent Progression:  Waxing and waning Chronicity:  New Context: bending over and head movement   Relieved by:  Being still (meclizine) Worsened by:  Turning head and movement Associated symptoms: nausea and vomiting   Associated symptoms: no blood in stool, no chest pain, no diarrhea, no headaches, no hearing loss, no palpitations, no shortness of breath, no syncope, no tinnitus, no vision changes and no weakness   Risk factors: no hx of vertigo   Emesis   Pertinent negatives include no abdominal pain, no arthralgias, no chills, no cough, no diarrhea, no fever and no headaches.    Past Medical History:  Diagnosis Date  . GERD (gastroesophageal reflux disease)    occ  . Papilloma of breast   . UTI (urinary tract infection)     Patient Active Problem List   Diagnosis Date Noted  . Low back pain 08/05/2017  . Pre-diabetes 05/21/2017  . Vitamin D deficiency 05/21/2017  . Insomnia 03/27/2016  . Atypical ductal hyperplasia of right breast 08/22/2015  . Post-operative state 09/20/2012    Past Surgical History:  Procedure Laterality Date  . BREAST LUMPECTOMY WITH RADIOACTIVE SEED LOCALIZATION Right 09/11/2016   Procedure: RIGHT BREAST LUMPECTOMY WITH RADIOACTIVE SEED LOCALIZATION;  Surgeon: Erroll Luna, MD;  Location: [facility];  Service: General;  Laterality: Right;  . CESAREAN SECTION  12/04/93, 01/05/79  . ENDOMETRIAL ABLATION  2008  . FRACTURE SURGERY     rt pinkie toe  . PARTIAL MASTECTOMY WITH NEEDLE LOCALIZATION  09/01/2012   Procedure: PARTIAL  MASTECTOMY WITH NEEDLE LOCALIZATION;  Surgeon: Marcello Moores A. Cornett, MD;  Location: [facility];  Service: General;  Laterality: Right;     OB History   None      Home Medications    Prior to Admission medications   Medication Sig Start Date End Date Taking? Authorizing Provider  Cholecalciferol (VITAMIN D-3) 1000 units CAPS Take 1 capsule by mouth daily.    [provider]  ibuprofen (ADVIL,MOTRIN) 200 MG tablet Take 600 mg by mouth every 6 (six) hours as needed for headache or moderate pain.    [provider]  meloxicam (MOBIC) 7.5 MG tablet TAKE 1 TABLET(7.5 MG) BY MOUTH DAILY Patient taking differently: Take 7.5 mg by mouth as needed.  02/16/18   Debbrah Alar, NP  valACYclovir (VALTREX) 500 MG tablet Take 1 tablet (500 mg total) by mouth 2 (two) times daily as needed. For outbreaks 06/03/18   Copland, Gay Filler, MD    Family History Family History  Problem Relation Age of Onset  . Cancer Mother        multiple myeloma  . Hypertension Mother   . Cancer Brother        leukemia  . Cancer Maternal Aunt        breast  . Cancer Maternal Aunt   . COPD Maternal Aunt        breast - great aunt    Social History Social History   Tobacco Use  . Smoking status: Never Smoker  .  Smokeless tobacco: Never Used  Substance Use Topics  . Alcohol use: Yes    Comment: occasional  . Drug use: No     Allergies   Ciprofloxacin and Sulfa antibiotics   Review of Systems Review of Systems  Constitutional: Negative for chills and fever.  HENT: Negative for ear pain, hearing loss, sore throat and tinnitus.   Eyes: Negative for pain and visual disturbance.  Respiratory: Negative for cough and shortness of breath.   Cardiovascular: Negative for chest pain, palpitations and syncope.  Gastrointestinal: Positive for nausea and vomiting. Negative for abdominal pain, blood in stool and diarrhea.  Genitourinary: Negative for dysuria and hematuria.  Musculoskeletal: Negative  for arthralgias and back pain.  Skin: Negative for color change and rash.  Neurological: Positive for dizziness. Negative for tremors, seizures, syncope, facial asymmetry, speech difficulty, weakness, light-headedness, numbness and headaches.  All other systems reviewed and are negative.    Physical Exam Updated Vital Signs  ED Triage Vitals  Enc Vitals Group     BP 07/06/18 1215 (!) 148/83     Pulse Rate 07/06/18 1215 78     Resp 07/06/18 1215 15     Temp 07/06/18 1216 97.7 F (36.5 C)     Temp Source 07/06/18 1216 Oral     SpO2 07/06/18 1212 98 %     Weight --      Height --      Head Circumference --      Peak Flow --      Pain Score 07/06/18 1216 4     Pain Loc --      Pain Edu? --      Excl. in [place]? --     Physical Exam  Constitutional: She is oriented to person, place, and time. She appears well-developed and well-nourished. No distress.  HENT:  Head: Normocephalic and atraumatic.  Eyes: Pupils are equal, round, and reactive to light. Conjunctivae and EOM are normal. Right eye exhibits no discharge. Left eye exhibits no discharge.  Neck: Normal range of motion. Neck supple.  Cardiovascular: Normal rate and regular rhythm.  No murmur heard. Pulmonary/Chest: Effort normal and breath sounds normal. No respiratory distress.  Abdominal: Soft. There is no tenderness.  Musculoskeletal: She exhibits no edema.  Neurological: She is alert and oriented to person, place, and time. No cranial nerve deficit or sensory deficit. She exhibits normal muscle tone. Coordination normal.  5+ out of 5 strength throughout, normal sensation, normal finger-to-nose finger, no pronator drift, positive Dix-Hallpike when looking to the left, no obvious vertical or horizontal nystagmus  Skin: Skin is warm and dry.  Psychiatric: She has a normal mood and affect.  Nursing note and vitals reviewed.    ED Treatments / Results  Labs (all labs ordered are listed, but only abnormal results are  displayed) Labs Reviewed  COMPREHENSIVE METABOLIC PANEL - Abnormal; Notable for the following components:      Result Value   Potassium 3.4 (*)    Glucose, Bld 141 (*)    Calcium 8.5 (*)    Total Protein 6.0 (*)    All other components within normal limits  CBC - Abnormal; Notable for the following components:   MCH 25.7 (*)    All other components within normal limits  LIPASE, BLOOD  URINALYSIS, ROUTINE W REFLEX MICROSCOPIC  I-STAT BETA HCG BLOOD, ED ([facility], [facility], [facility] ONLY)    EKG EKG Interpretation  Date/Time:  Tuesday July 06 2018 12:15:17 EST Ventricular Rate:  80 PR  Interval:    QRS Duration: 90 QT Interval:  399 QTC Calculation: 461 R Axis:   52 Text Interpretation:  Sinus rhythm Probable left atrial enlargement Confirmed by Lennice Sites [phone]) on 07/06/2018 1:44:34 PM   Radiology No results found.  Procedures Procedures (including critical care time)  Medications Ordered in ED Medications  iopamidol (ISOVUE-370) 76 % injection (has no administration in time range)  ondansetron (ZOFRAN) injection 4 mg (4 mg Intravenous Given 07/06/18 1314)  diazepam (VALIUM) tablet 2 mg (2 mg Oral Given 07/06/18 1314)  lactated ringers bolus 1,000 mL (0 mLs Intravenous Stopped 07/06/18 1421)  prochlorperazine (COMPAZINE) injection 10 mg (10 mg Intravenous Given 07/06/18 1449)     Initial Impression / Assessment and Plan / ED Course  I have reviewed the triage vital signs and the nursing notes.  Pertinent labs & imaging results that were available during my care of the patient were reviewed by me and considered in my medical decision making (see chart for details).     Sheryl Bryan is a 57 year old female with history of UTI, reflux who presents to the ED with dizziness, emesis.  Patient with normal vitals.  No fever.  Patient diagnosed with vertigo 3 days ago at outside hospital and started on meclizine.  Symptoms have improved but she states she still has intermittent  symptoms.  She had intense episode of nausea, vomiting, dizziness just prior to arrival and states that she still feels dizzy with nausea.  She states that the room is spinning.  Denies any vision changes, headaches.  No weakness or numbness.  Patient with overall normal neurological exam.  She does have a positive Dix-Hallpike that is worse with turning her head to the left.  Patient has history and physical that is consistent with peripheral vertigo.  Will treat with Valium and check basic labs and give IV fluids.  Patient also given IV Zofran.  Patient with overall unremarkable labs.  No significant anemia, electrolyte abnormality, kidney injury.  Patient felt better following IV fluids and Valium but when re-evaluated she continued to have gait unsteadiness, dizziness, intense nausea feeling.  She has been fairly persistent in her symptoms since arrival and therefore will initiate further stroke work-up with CTA of head and neck and MRI.  Patient was handed off to oncoming ED staff with patient pending imaging.  Please see their note for further results, evaluation, disposition of the patient.  This chart was dictated using voice recognition software.  Despite best efforts to proofread,  errors can occur which can change the documentation meaning.   Final Clinical Impressions(s) / ED Diagnoses   Final diagnoses:  Dizziness    ED Discharge Orders    None       Lennice Sites, DO 07/06/18 1621

## 2018-07-06 NOTE — ED Notes (Signed)
This RN assisted patient with ambulating down hallway. Patient had unsteady gait and stated she felt very dizzy. Pt also had episode of emesis while standing. Pt assisted back to the bed and cold towel placed on neck and forehead. New orders for anti emetics by Dr. Ronnald Nian. Pt agrees to plan.

## 2018-07-06 NOTE — H&P (Signed)
History and Physical   Sheryl Bryan:[id number] DOB: [birth year] DOA: 07/06/2018  PCP: Darreld [facility], MD  Chief Complaint: dizziness  HPI: This is a 57 year old woman without chronic medical problems reporting to the emergency department with dizziness.  History is obtained via patient report as well as husband in addition to emergency medicine team verbal report.  At baseline, the patient works in the [employer] position, never smoker, seldom drinks alcohol.  Lives in [place] with her husband.  She was in her usual state of health when 2 days ago in the evening she was taking her father to the hospital with in [place].  She had acute onset of emesis as well as feeling like the floor was moving.  She went to the emergency department herself in [place] where she was diagnosed with vertigo and prescribed meclizine.  Over the ensuing days her symptoms failed to improve, had persistent nausea and vomiting anytime she stood up to walk.  Persistent dizziness, meclizine did help her to sleep.  Due to these persistent symptoms, her husband called EMS which brought her to the emergency department.  She specifically denies any focal weakness, numbness, lack of sensation.  Has never had these symptoms before.    ED Course: In the emergency department vital signs remarkable for normal heart rate, systolic blood pressure 314H.  Labs remarkable for normal CBC, BMP with potassium of 3.4.  Pregnancy test negative.  Lipase normal.  CT angiogram with contrast revealed right internal carotid artery origin with fibrofatty plaque,  moderate stenosis.  Neurology was consulted who believes that the patient's vertigo is peripheral in nature and recommends course of prednisone with taper.  Hospital medicine consulted for further management.  Emergency medicine team perform Dix-Hallpike maneuver which was positive.  Of note, initially the emergency medicine team ordered an MRI of the  brain, however this was unable to be performed due to incompatible substance holding her hair in place (metal).  Review of Systems: A complete ROS was obtained; pertinent positives negatives are denoted in the HPI. Otherwise, all systems are negative.   Past Medical History:  Diagnosis Date  . GERD (gastroesophageal reflux disease)    occ  . Papilloma of breast   . UTI (urinary tract infection)    Social History   Socioeconomic History  . Marital status: Married    Spouse name: Not on file  . Number of children: Not on file  . Years of education: Not on file  . Highest education level: Not on file  Occupational History  . Not on file  Social Needs  . Financial resource strain: Not on file  . Food insecurity:    Worry: Not on file    Inability: Not on file  . Transportation needs:    Medical: Not on file    Non-medical: Not on file  Tobacco Use  . Smoking status: Never Smoker  . Smokeless tobacco: Never Used  Substance and Sexual Activity  . Alcohol use: Yes    Comment: occasional  . Drug use: No  . Sexual activity: Not on file  Lifestyle  . Physical activity:    Days per week: Not on file    Minutes per session: Not on file  . Stress: Not on file  Relationships  . Social connections:    Talks on phone: Not on file    Gets together: Not on file    Attends religious service: Not on file    Active member  of club or organization: Not on file    Attends meetings of clubs or organizations: Not on file    Relationship status: Not on file  . Intimate partner violence:    Fear of current or ex partner: Not on file    Emotionally abused: Not on file    Physically abused: Not on file    Forced sexual activity: Not on file  Other Topics Concern  . Not on file  Social History Narrative  . Not on file   Family History  Problem Relation Age of Onset  . Cancer Mother        multiple myeloma  . Hypertension Mother   . Cancer Brother        leukemia  . Cancer Maternal  Aunt        breast  . Cancer Maternal Aunt   . COPD Maternal Aunt        breast - great aunt    Physical Exam: Vitals:   07/06/18 1245 07/06/18 1445 07/06/18 1515 07/06/18 1700  BP: (!) 141/77 (!) 156/85 (!) 148/86 (!) 160/80  Pulse: 70  86 98  Resp: _0 Temp:      TempSrc:      SpO2: 100%  99% 98%   General: Black woman, resting comfortably, appears tired. ENT: Grossly normal hearing, MMM. Cardiovascular: RRR. No M/R/G. No LE edema.  Respiratory: CTA bilaterally. No wheezes or crackles. Normal respiratory effort. Abdomen: Soft, non-tender. Bowel sounds present.  Skin: No rash or induration seen on limited exam. Musculoskeletal: Grossly normal tone BUE/BLE. Appropriate ROM.  Psychiatric: Grossly normal mood and affect. Neurologic: Finger-to-nose and heel-to-shin within normal limits, cardinal eye movements with nystagmus horizontally bilaterally, pupils bilaterally round reactive to light, answering questions appropriately, strength preserved x4 extremities proximally.  I have personally reviewed the following labs, culture data, and imaging studies.  Assessment/Plan:  #Acute unrelenting vertigo Course: onset of symptoms started on 07/04/2018 evening, was evaluated at outside ED - prescribed meclizine; symptoms persisted; associated symptoms with vertigo of nausea and emesis A/P: no signs / symptoms pointing towards a central cause of vertigo (diplopia, dysarthria, etc.); neurology team concurs peripheral cause of vertigo; possibly vestibular neuritis.  Will initiate prednisone per neurology team's recommendations - initial dose of 60 mg x 5 days then plan to taper.  IVF support (LR 100 cc q h x 10 hrs, then re-assess), ondansetron for nausea (patient reports this was most efficacious) with as needed compazine and/or lorazepam for breakthrough.  Anticipate need for vestibular rehab - PT order placed.  #Other problems: -Right ICA stenosis: ultrasound to further  delineate -Hypokalemia: IV replacement and then re-check in AM  DVT prophylaxis: Subq Lovenox Code Status: full Disposition Plan: Anticipate D/C home as early as 12/11 if symptomatically improved  Consults called: neurology consulted by the emergency medicine team; PT order placed Admission status: hospital medicine service   Cheri Rous, MD [facility] Page:[phone]  If 7PM-7AM, please contact night-coverage [email or web address] Password [email or web address]  This document was created using the aid of voice recognition / dication software.

## 2018-07-06 NOTE — ED Notes (Signed)
Patient aware of need for urine specimen.

## 2018-07-06 NOTE — Telephone Encounter (Signed)
Pt called to report that she has had dizziness and nausea since Sunday.  Sunday she was seen in the ED in [place] for her symptoms and given medication for dizziness. Since then her symptoms have not improved.  She denies cold symptoms. She denies fever. She describes dizziness as a lightheaded feeling. It does not change with position change. Her husband helps to keep her balanced when walking.   She has been vomiting.  Per protocol appointment scheduled to day at office.  Care advice read to patient. Pt verbalized understanding of all instructions  Reason for Disposition . [1] MODERATE dizziness (e.g., interferes with normal activities) AND [2] has NOT been evaluated by physician for this  (Exception: dizziness caused by heat exposure, sudden standing, or poor fluid intake)  Answer Assessment - Initial Assessment Questions 1. DESCRIPTION: "Describe your dizziness."     Lightheaded nausea 2. LIGHTHEADED: "Do you feel lightheaded?" (e.g., somewhat faint, woozy, weak upon standing)     Lightheaded and weakness 3. VERTIGO: "Do you feel like either you or the room is spinning or tilting?" (i.e. vertigo)     no 4. SEVERITY: "How bad is it?"  "Do you feel like you are going to faint?" "Can you stand and walk?"   - MILD - walking normally   - MODERATE - interferes with normal activities (e.g., work, school)    - SEVERE - unable to stand, requires support to walk, feels like passing out now.      moderated 5. ONSET:  "When did the dizziness begin?"     sunday 6. AGGRAVATING FACTORS: "Does anything make it worse?" (e.g., standing, change in head position)     no 7. HEART RATE: "Can you tell me your heart rate?" "How many beats in 15 seconds?"  (Note: not all patients can do this)       no 8. CAUSE: "What do you think is causing the dizziness?"     unsure 9. RECURRENT SYMPTOM: "Have you had dizziness before?" If so, ask: "When was the last time?" "What happened that time?"     no 10. OTHER  SYMPTOMS: "Do you have any other symptoms?" (e.g., fever, chest pain, vomiting, diarrhea, bleeding)       Nausea vomiting 11. PREGNANCY: "Is there any chance you are pregnant?" "When was your last menstrual period?"       No menapause  Protocols used: DIZZINESS Heidi Dach

## 2018-07-06 NOTE — Consult Note (Signed)
Neurology Consultation Reason for Consult: Vertigo Referring Physician: Roxine Caddy  CC: Vertigo  History is obtained from: Patient  HPI: Sheryl Bryan is a 57 y.o. female presenting with vertigo and disequilibrium that started on Sunday.  She states that it started rather abruptly and that she went to a hospital in [place] was diagnosed with vertigo and sent home.  Since that time, she has not been eating or drinking well, her husband reports that she does not seem to  keep anything down.  With any type of walking her symptoms get significantly worse and she has been unsteady.  She is also been nauseated and vomiting with anytime she tries to walk.  ROS: A 14 point ROS was performed and is negative except as noted in the HPI.   Past Medical History:  Diagnosis Date  . GERD (gastroesophageal reflux disease)    occ  . Papilloma of breast   . UTI (urinary tract infection)      Family History  Problem Relation Age of Onset  . Cancer Mother        multiple myeloma  . Hypertension Mother   . Cancer Brother        leukemia  . Cancer Maternal Aunt        breast  . Cancer Maternal Aunt   . COPD Maternal Aunt        breast - great aunt     Social History:  reports that she has never smoked. She has never used smokeless tobacco. She reports that she drinks alcohol. She reports that she does not use drugs.   Exam: Current vital signs: BP (!) 160/80   Pulse 98   Temp 97.7 F (36.5 C) (Oral)   Resp 17   SpO2 98%  Vital signs in last 24 hours: Temp:  [97.7 F (36.5 C)] 97.7 F (36.5 C) (12/10 1216) Pulse Rate:  [70-98] 98 (12/10 1700) Resp:  [13-20] 17 (12/10 1700) BP: (141-160)/(77-86) 160/80 (12/10 1700) SpO2:  [97 %-100 %] 98 % (12/10 1700)   Physical Exam  Constitutional: Appears well-developed and well-nourished.  Psych: Affect appropriate to situation Eyes: No scleral injection HENT: No OP obstrucion Head: Normocephalic.  Cardiovascular: Normal rate and  regular rhythm.  Respiratory: Effort normal, non-labored breathing GI: Soft.  No distension. There is no tenderness.  Skin: WDI  Neuro: Mental Status: Patient is awake, alert, oriented to person, place, month, year, and situation. Patient is able to give a clear and coherent history. No signs of aphasia or neglect Cranial Nerves: II: Visual Fields are full. Pupils are equal, round, and reactive to light.   III,IV, VI: EOMI without ptosis or diploplia.  She has persistent rotary nystagmus with a right beat in all directions of gaze. V: Facial sensation is symmetric to temperature VII: Facial movement is symmetric.  VIII: hearing is intact to voice X: Uvula elevates symmetrically XI: Shoulder shrug is symmetric. XII: tongue is midline without atrophy or fasciculations.  Motor: Tone is normal. Bulk is normal. 5/5 strength was present in all four extremities.  Sensory: Sensation is symmetric to light touch and temperature in the arms and legs. 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  Multiple findings suggest peripheral nature including a positive head impulse test, nystagmus that does not change with direction of gaze, and lack of any correlated symptoms fitting ataxia.    I think that ultrasound may help further delineate the urgency of intervention on this ICA plaque.  It is incidental and not related to her current presentation.  Recommendations: 1) prednisone 49m daily x 5 days, followed by 50 x1 day, then 40 x1 day, then 30 x 1 day, then 20 x 1 day, then 10 x 1 day, then stop. Total 10 days of steroids.  2) carotid  ultrasound 3) physical therapy 4) neurology will follow   MRoland Rack MD [facility] [phone] If 7pm- 7am, please page neurology on call as listed in [facility]

## 2018-07-06 NOTE — ED Notes (Signed)
Patient transported to CT 

## 2018-07-06 NOTE — ED Notes (Signed)
Care handoff [facility] RN

## 2018-07-06 NOTE — ED Notes (Signed)
GOT PATIENT INTO A GOWN ON THE MONITOR PATIENT IS RESTING WITH CALL bELL IN REACH

## 2018-07-06 NOTE — ED Triage Notes (Addendum)
Pt here for dizziness x2 days. Diagnosed with vertigo at [facility] on Sunday and given prescription for meclilzine with no relief. States she is here for symptom management. Pt took Meclizine this morning and vomited it back up. 4 episodes of emesis today. Given 4mg  zofran by EMS. VSS at this time.

## 2018-07-06 NOTE — ED Notes (Signed)
Please call husband Leontine Radman with update [phone]

## 2018-07-06 NOTE — Progress Notes (Signed)
Spoke to Dr. Leonel Ramsay and pt's RN in ED. Both are aware we are unable to get a Diagnostic MRI on her due to sewn in metal in hair.  Dr. Carlis Abbott (radiologist) reviewed images and verified they were non diagnostic until pt removes the metal from her hair.

## 2018-07-06 NOTE — ED Provider Notes (Signed)
Patient has ongoing vertiginous type symptoms.  We were unable to get an MRI due to metal and awake that she is wearing that will not be removed.  CT angios shows potentially unstable plaque in her right ICA.  Neurology has consulted with the patient and feels that this is more likely peripheral vertigo but will need admission for further evaluation of the plaque and symptomatic treatment.  I spoke with the hospitalist, Dr Stana Bunting, who will admit the patient for further treatment.   Malvin Johns, MD 07/06/18 2139

## 2018-07-07 ENCOUNTER — Encounter (HOSPITAL_COMMUNITY): Payer: BLUE CROSS/BLUE SHIELD

## 2018-07-07 ENCOUNTER — Other Ambulatory Visit: Payer: Self-pay

## 2018-07-07 ENCOUNTER — Encounter (HOSPITAL_COMMUNITY): Payer: Self-pay | Admitting: Emergency Medicine

## 2018-07-07 DIAGNOSIS — R110 Nausea: Secondary | ICD-10-CM

## 2018-07-07 DIAGNOSIS — R42 Dizziness and giddiness: Secondary | ICD-10-CM | POA: Diagnosis not present

## 2018-07-07 MED ORDER — POTASSIUM CHLORIDE CRYS ER 20 MEQ PO TBCR
40.0000 meq | EXTENDED_RELEASE_TABLET | Freq: Once | ORAL | Status: DC
  Filled 2018-07-07: qty 2

## 2018-07-07 MED ORDER — ONDANSETRON HCL 4 MG PO TABS: 4.0000 mg | ORAL_TABLET | Freq: Every day | ORAL | 1 refills | Status: DC | PRN

## 2018-07-07 MED ORDER — PREDNISONE 10 MG PO TABS: ORAL_TABLET | ORAL | 0 refills | Status: DC

## 2018-07-07 NOTE — Evaluation (Addendum)
Physical Therapy Evaluation Patient Details Name: Sheryl Bryan MRN: [id number] DOB: September 10, 1960 Today's Date: 07/07/2018   History of Present Illness  57 y.o. female admitted on 07/06/18 for dizziness.  Unable to do MRI due to metal in her hair that cannot be easily removed.  Pt suspected peripheral vestibular issue.  Pt also found to have R ICA stenosis, and hypokalemia (likely from vomiting).  Pt with other significant PMH of partial mastectomy R, R 5th toe fx surgery.    Clinical Impression  Pt is very symptomatic and shows (+) testing for L ear vestibular neuritis.  This is not a canal problem.   Compensatory strategies initiated and x1 exercises given to husband with instructions not to start them for a few more days until her symptoms reduce.  He is able to assist her at home until she becomes more functional.  I recommend OP PT vestibular follow up and they live in [place], so [facility] OP therapy clinic is closest to them that has vestibular therapy.  PT will continue to follow acutely for safe mobility progression   Medbridge Access Code: [email or web address]    Follow Up Recommendations Outpatient PT;Other (comment)(for vestibular rehab in [place])    Equipment Recommendations  None recommended by PT    Recommendations for Other Services   NA    Precautions / Restrictions Precautions Precautions: Fall Precaution Comments: due to dizziness with movement.      Mobility  Bed Mobility Overal bed mobility: Needs Assistance Bed Mobility: Supine to Sit     Supine to sit: Min guard     General bed mobility comments: Min guard assist due to increased sway during transitional movements.   Transfers Overall transfer level: Needs assistance Equipment used: 2 person hand held assist Transfers: Sit to/from Stand Sit to Stand: +2 physical assistance;Min assist         General transfer comment: Two person min hand held assist to come to standing and  stabilize  Ambulation/Gait Ambulation/Gait assistance: +2 physical assistance;Min assist Gait Distance (Feet): 35 Feet Assistive device: 2 person hand held assist Gait Pattern/deviations: Step-through pattern;Staggering left;Staggering right Gait velocity: decreased Gait velocity interpretation: <1.31 ft/sec, indicative of household ambulator General Gait Details: Pt with staggering, but not all that uncoordinated (not ataxic) gait pattern.  She stabilized lightly on therapist's and husband's hands and was able to walk a short distance using targeting and segmental turning strategies to help decrease symptoms.  Despite compensatory strategies used, she still vomited once we got sitting down back in the room.  RN notified.          Balance Overall balance assessment: Needs assistance Sitting-balance support: Feet supported;Bilateral upper extremity supported Sitting balance-Leahy Scale: Poor Sitting balance - Comments: (+) sway in sitting EOB.    Standing balance support: Bilateral upper extremity supported Standing balance-Leahy Scale: Poor Standing balance comment: needs external support in standing.                07/07/18 1232  Vestibular Assessment  General Observation no recent head trauma/whiplash injury, wears glasses to drive at night, but no other time, no double vision or blurry vision, dizziness is present any time her eyes are open. No recent URI, no recent antibiotic use or sick contacts.   Symptom Behavior  Type of Dizziness Imbalance (floor is moving)  Frequency of Dizziness constant when eyes are open  Duration of Dizziness long  Aggravating Factors Activity in general (eyes open)  Relieving Factors Closing eyes;Rest;Head stationary;Slow  movements  Occulomotor Exam  Occulomotor Alignment Normal  Spontaneous Right beating nystagmus (it has a right upward rotation component as well)  Gaze-induced Right beating nystagmus with R gaze  Smooth Pursuits  Comment (R beating with right gaze, central gaze and left gaze)  Saccades Intact (with intrusions due to the spontaneous nystagmus)  Vestibulo-Occular Reflex  VOR 1 Head Only (x 1 viewing) horizontal and vertical head shaking are both VERY symptomatic and difficult   Comment HIT (+) more so to the left, hard to differentiate corrective jump vs beating of nystagmus to the right.   Auditory  Comments grossly intact with finger rub test   07/07/2018 Canal testing not indicated due to spontaneous nystagmus with visual testing.  If [employer] preformed, she would likely have a false (+) due to her spontaneous nystagmus.                 Pertinent Vitals/Pain Pain Assessment: No/denies pain    Home Living Family/patient expects to be discharged to:: Private residence Living Arrangements: Spouse/significant other Available Help at Discharge: Family;Available 24 hours/day(husband can work from home) Type of Home: House Home Access: Stairs to enter     [place]: One level        Prior Function Level of Independence: Independent         Comments: works     Journalist, newspaper   Dominant Hand: Right    Extremity/Trunk Assessment   Upper Extremity Assessment Upper Extremity Assessment: Overall WFL for tasks assessed    Lower Extremity Assessment Lower Extremity Assessment: Overall WFL for tasks assessed    Cervical / Trunk Assessment Cervical / Trunk Assessment: Normal  Communication   Communication: No difficulties  Cognition Arousal/Alertness: Awake/alert Behavior During Therapy: Flat affect Overall Cognitive Status: Within Functional Limits for tasks assessed                                 General Comments: Pt does not feel well flat affect to be expected      General Comments General comments (skin integrity, edema, etc.): Took precautions with cold towels and emesis bag during gait.    Exercises Other Exercises Other Exercises: x1  seated exercises given and reviewed with husband.  Pt is not quite ready to do these yet, so instructions to wait a few days and then try them as she is feeling better.  [employer] login instructions given to husband in case he wants to watch the videos to review how to preform instructed exercises.   Other Exercises: I gave husband multiple target "A" to hang around the room and bathroom at home so that pt can have easy targets to look at while moving around her space.    Assessment/Plan    PT Assessment Patient needs continued PT services  PT Problem List Decreased activity tolerance;Decreased balance;Decreased mobility       PT Treatment Interventions DME instruction;Gait training;Stair training;Functional mobility training;Therapeutic activities;Balance training;Therapeutic exercise;Neuromuscular re-education;Patient/family education    PT Goals (Current goals can be found in the Care Plan section)  Acute Rehab PT Goals Patient Stated Goal: to feel better and get back to normal PT Goal Formulation: With patient Time For Goal Achievement: 07/21/18 Potential to Achieve Goals: Good    Frequency Min 4X/week           AM-PAC PT "6 Clicks" Mobility  Outcome Measure Help needed turning from your back to your side while  in a flat bed without using bedrails?: A Little Help needed moving from lying on your back to sitting on the side of a flat bed without using bedrails?: A Little Help needed moving to and from a bed to a chair (including a wheelchair)?: A Little Help needed standing up from a chair using your arms (e.g., wheelchair or bedside chair)?: A Little Help needed to walk in hospital room?: A Little Help needed climbing 3-5 steps with a railing? : A Little 6 Click Score: 18    End of Session Equipment Utilized During Treatment: Gait belt Activity Tolerance: Other (comment)(limited by dizziness and nausea) Patient left: in chair;with call bell/phone within reach;with  family/visitor present Nurse Communication: Mobility status PT Visit Diagnosis: Unsteadiness on feet (R26.81);Dizziness and giddiness (R42)    Time: 9485-4627 PT Time Calculation (min) (ACUTE ONLY): 44 min   Charges:           Wells Guiles B. Sheena Donegan, PT, DPT  Acute Rehabilitation [phone] pager #[phone] office   PT Evaluation $PT Eval Moderate Complexity: 1 Mod PT Treatments $Gait Training: 8-22 mins $Self Care/Home Management: 8-22        07/07/2018, 12:38 PM

## 2018-07-07 NOTE — Progress Notes (Signed)
Patient given discharge instructions and verbalized understanding.  Patient stable to discharge at this time.  Patient is alert and oriented to baseline.  No distressed noted at this time.  All belongings taken with the patient at discharge.   

## 2018-07-07 NOTE — Discharge Summary (Signed)
Physician Discharge Summary  Sheryl Bryan:[id number] DOB: 1961/04/27 DOA: 07/06/2018  PCP: Darreld Mclean, MD  Admit date: 07/06/2018  Discharge date: 07/07/2018  Admitted From: Home  Disposition:  Home  Discharge Condition: Stable  CODE STATUS:  Full  Diet recommendation:  Heart Healthy   Brief/Interim Summary:  This is a 57 year old female with no significant medical history presented to the hospital with dizziness.  In the emergency department, vital signs remarkable for normal heart rate, systolic blood pressure 956O.  Labs remarkable for normal CBC, BMP with potassium of 3.4.  Pregnancy test was negative.  Lipase normal.  CT angiogram with contrast revealed right internal carotid artery origin with fibrofatty plaque,  moderate stenosis.  Neurology was consulted who believed that the patient's vertigo is peripheral in nature and recommended course of prednisone with taper.   Of note, initially the emergency medicine team ordered an MRI of the brain, however this was unable to be performed due to incompatible substance holding her hair in place (metal).  Patient was seen by physical therapy who recommended vestibular rehab on discharge.  Patient symptomatically feels better she has been considered stable for disposition home with steroid taper for possible vestibular neuronitis.  She was advised to follow-up with her primary care physician and potentially get a ENT referral if vertigo persists.  I also spoke with the patient's family at bedside regarding the plan for disposition.   Discharge Diagnoses:  Active Problems:   Dizziness, likely vestibular neuronitis   Discharge Instructions  Discharge Instructions    Diet - low sodium heart healthy   Complete by:  As directed    Discharge instructions   Complete by:  As directed    Patient complete the course of prednisone.  Follow-up with physical therapy at [facility] for vestibular treatment.  Follow-up with  your primary care physician within 1 week.  If you persist to have symptoms, please discuss about getting ENT referral.   Increase activity slowly   Complete by:  As directed      Allergies as of 07/07/2018      Reactions   Ciprofloxacin Nausea And Vomiting   Sulfa Antibiotics Hives   Spreads all over the body      Medication List    TAKE these medications   ibuprofen 200 MG tablet Commonly known as:  ADVIL,MOTRIN Take 600 mg by mouth every 6 (six) hours as needed for headache or moderate pain.   ondansetron 4 MG tablet Commonly known as:  ZOFRAN Take 1 tablet (4 mg total) by mouth daily as needed for nausea or vomiting.   predniSONE 10 MG tablet Commonly known as:  DELTASONE prednisone 60mg  daily x 4 days, followed by 50mg  x1 day, then 40mg  x1 day, then 30mg  x 1 day, then 20mg  x 1 day, then 10mg  x 1 day, then stop.   valACYclovir 500 MG tablet Commonly known as:  VALTREX Take 1 tablet (500 mg total) by mouth 2 (two) times daily as needed. For outbreaks   Vitamin D-3 25 MCG (1000 UT) Caps Take 1 capsule by mouth daily.       Allergies  Allergen Reactions  . Ciprofloxacin Nausea And Vomiting  . Sulfa Antibiotics Hives    Spreads all over the body    Consultations:  Neurology   Procedures/Studies: Ct Angio Head W Or Wo Contrast  Result Date: 07/06/2018 CLINICAL DATA:  Vertigo and dizziness for 2 days, not improved on medication. Vomiting today. EXAM: CT ANGIOGRAPHY HEAD  AND NECK TECHNIQUE: Multidetector CT imaging of the head and neck was performed using the standard protocol during bolus administration of intravenous contrast. Multiplanar CT image reconstructions and MIPs were obtained to evaluate the vascular anatomy. Carotid stenosis measurements (when applicable) are obtained utilizing NASCET criteria, using the distal internal carotid diameter as the denominator. CONTRAST:  119mL ISOVUE-370 IOPAMIDOL (ISOVUE-370) INJECTION 76% COMPARISON:  None. FINDINGS: CT  HEAD FINDINGS BRAIN: No intraparenchymal hemorrhage, mass effect nor midline shift. The ventricles and sulci are normal. No acute large vascular territory infarcts. No abnormal extra-axial fluid collections. Basal cisterns are patent. VASCULAR: Trace calcific atherosclerosis carotid siphon. SKULL/SOFT TISSUES: No skull fracture. No significant soft tissue swelling. ORBITS/SINUSES: The included ocular globes and orbital contents are normal.Trace paranasal sinus mucosal thickening. Mastoid air cells are well aerated. OTHER: None. CTA NECK FINDINGS: AORTIC ARCH: A common origin innominate and LEFT common carotid artery. LEFT vertebral artery arises directly from the aortic arch. The origins of the innominate, left Common carotid artery and subclavian artery are widely patent. RIGHT CAROTID SYSTEM: Common carotid artery is patent. No hemodynamically significant stenosis by NASCET criteria. Thickened fibrofatty plaque extending into the lumen (sagittal image 65). Patent internal carotid artery. LEFT CAROTID SYSTEM: Common carotid artery is patent, limited assessment due to streak artifact from refluxed venous contrast. Normal appearance of the carotid bifurcation without hemodynamically significant stenosis by NASCET criteria. Normal appearance of the internal carotid artery. VERTEBRAL ARTERIES:RIGHT vertebral artery is dominant. Normal appearance of the vertebral arteries, widely patent. SKELETON: No acute osseous process though bone windows have not been submitted. Severe LEFT C3-4 neural foraminal narrowing. OTHER NECK: Soft tissues of the neck are nonacute though, not tailored for evaluation. UPPER CHEST: Included lung apices are clear. Mild centrilobular emphysema. No superior mediastinal lymphadenopathy. CTA HEAD FINDINGS: ANTERIOR CIRCULATION: Patent cervical internal carotid arteries, petrous, cavernous and supra clinoid internal carotid arteries. Mild RIGHT paraclinoid calcific atherosclerosis. Patent anterior  communicating artery. Patent anterior and middle cerebral arteries, mild luminal irregularity compatible with atherosclerosis. No large vessel occlusion, significant stenosis, contrast extravasation or aneurysm. POSTERIOR CIRCULATION: Patent vertebral arteries, vertebrobasilar junction and basilar artery, as well as main branch vessels. Patent posterior cerebral arteries, mild luminal irregularity compatible with atherosclerosis. Moderate stenosis RIGHT P3 segment. Tiny LEFT posterior communicating artery present. No large vessel occlusion, significant stenosis, contrast extravasation or aneurysm. VENOUS SINUSES: Major dural venous sinuses are patent though not tailored for evaluation on this angiographic examination. ANATOMIC VARIANTS: None. DELAYED PHASE: No abnormal intracranial enhancement. MIP images reviewed. IMPRESSION: CT HEAD: 1. Negative CT HEAD with and without contrast for age. CTA NECK: 1. RIGHT ICA origin fibrofatty plaque may be unstable. 2. No hemodynamically significant stenosis. Patent vertebral arteries. 3. LEFT C3-4 neural foraminal narrowing. CTA HEAD: 1. No emergent large vessel occlusion or flow-limiting stenosis. 2. Moderate stenosis RIGHT P3 segment. Mild intracranial atherosclerosis. Acute findings discussed with and reconfirmed by Dr.BELFI on 07/06/2018 at 6:10 pm. Electronically Signed   By: Elon Alas M.D.   On: 07/06/2018 18:12   Ct Angio Neck W And/or Wo Contrast  Result Date: 07/06/2018 CLINICAL DATA:  Vertigo and dizziness for 2 days, not improved on medication. Vomiting today. EXAM: CT ANGIOGRAPHY HEAD AND NECK TECHNIQUE: Multidetector CT imaging of the head and neck was performed using the standard protocol during bolus administration of intravenous contrast. Multiplanar CT image reconstructions and MIPs were obtained to evaluate the vascular anatomy. Carotid stenosis measurements (when applicable) are obtained utilizing NASCET criteria, using the distal internal  carotid diameter  as the denominator. CONTRAST:  172mL ISOVUE-370 IOPAMIDOL (ISOVUE-370) INJECTION 76% COMPARISON:  None. FINDINGS: CT HEAD FINDINGS BRAIN: No intraparenchymal hemorrhage, mass effect nor midline shift. The ventricles and sulci are normal. No acute large vascular territory infarcts. No abnormal extra-axial fluid collections. Basal cisterns are patent. VASCULAR: Trace calcific atherosclerosis carotid siphon. SKULL/SOFT TISSUES: No skull fracture. No significant soft tissue swelling. ORBITS/SINUSES: The included ocular globes and orbital contents are normal.Trace paranasal sinus mucosal thickening. Mastoid air cells are well aerated. OTHER: None. CTA NECK FINDINGS: AORTIC ARCH: A common origin innominate and LEFT common carotid artery. LEFT vertebral artery arises directly from the aortic arch. The origins of the innominate, left Common carotid artery and subclavian artery are widely patent. RIGHT CAROTID SYSTEM: Common carotid artery is patent. No hemodynamically significant stenosis by NASCET criteria. Thickened fibrofatty plaque extending into the lumen (sagittal image 65). Patent internal carotid artery. LEFT CAROTID SYSTEM: Common carotid artery is patent, limited assessment due to streak artifact from refluxed venous contrast. Normal appearance of the carotid bifurcation without hemodynamically significant stenosis by NASCET criteria. Normal appearance of the internal carotid artery. VERTEBRAL ARTERIES:RIGHT vertebral artery is dominant. Normal appearance of the vertebral arteries, widely patent. SKELETON: No acute osseous process though bone windows have not been submitted. Severe LEFT C3-4 neural foraminal narrowing. OTHER NECK: Soft tissues of the neck are nonacute though, not tailored for evaluation. UPPER CHEST: Included lung apices are clear. Mild centrilobular emphysema. No superior mediastinal lymphadenopathy. CTA HEAD FINDINGS: ANTERIOR CIRCULATION: Patent cervical internal carotid  arteries, petrous, cavernous and supra clinoid internal carotid arteries. Mild RIGHT paraclinoid calcific atherosclerosis. Patent anterior communicating artery. Patent anterior and middle cerebral arteries, mild luminal irregularity compatible with atherosclerosis. No large vessel occlusion, significant stenosis, contrast extravasation or aneurysm. POSTERIOR CIRCULATION: Patent vertebral arteries, vertebrobasilar junction and basilar artery, as well as main branch vessels. Patent posterior cerebral arteries, mild luminal irregularity compatible with atherosclerosis. Moderate stenosis RIGHT P3 segment. Tiny LEFT posterior communicating artery present. No large vessel occlusion, significant stenosis, contrast extravasation or aneurysm. VENOUS SINUSES: Major dural venous sinuses are patent though not tailored for evaluation on this angiographic examination. ANATOMIC VARIANTS: None. DELAYED PHASE: No abnormal intracranial enhancement. MIP images reviewed. IMPRESSION: CT HEAD: 1. Negative CT HEAD with and without contrast for age. CTA NECK: 1. RIGHT ICA origin fibrofatty plaque may be unstable. 2. No hemodynamically significant stenosis. Patent vertebral arteries. 3. LEFT C3-4 neural foraminal narrowing. CTA HEAD: 1. No emergent large vessel occlusion or flow-limiting stenosis. 2. Moderate stenosis RIGHT P3 segment. Mild intracranial atherosclerosis. Acute findings discussed with and reconfirmed by Dr.BELFI on 07/06/2018 at 6:10 pm. Electronically Signed   By: Elon Alas M.D.   On: 07/06/2018 18:12      Subjective: Patient feels less dizziness today, has less nausea.  Wishes to be discharged home.  Discharge Exam: Vitals:   07/07/18 0249 07/07/18 1221  BP: (!) 143/80 (!) 145/82  Pulse: 85 90  Resp: 16 17  Temp: 98.4 F (36.9 C) 98.1 F (36.7 C)  SpO2: 100% 100%   Vitals:   07/06/18 2256 07/07/18 0249 07/07/18 0254 07/07/18 1221  BP: (!) 164/70 (!) 143/80  (!) 145/82  Pulse: 80 85  90  Resp:  16 16  17   Temp:  98.4 F (36.9 C)  98.1 F (36.7 C)  TempSrc:  Oral  Oral  SpO2: 96% 100%  100%  Weight:   73 kg   Height:   5\' 3"  (1.6 m)    General: Pt is  alert, awake, not in acute distress, closing her eyes episodically due to dizziness. Cardiovascular: RRR, S1/S2 +, no rubs, no gallops Respiratory: CTA bilaterally, no wheezing, no rhonchi Abdominal: Soft, NT, ND, bowel sounds + CNS: non focal. Extremities: no edema, no cyanosis  The results of significant diagnostics from this hospitalization (including imaging, microbiology, ancillary and laboratory) are listed below for reference.    Microbiology: No results found for this or any previous visit (from the past 240 hour(s)).   Labs: BNP (last 3 results) No results for input(s): BNP in the last 8760 hours. Basic Metabolic Panel: Recent Labs  Lab 07/06/18 1218  NA 142  K 3.4*  CL 109  CO2 24  GLUCOSE 141*  BUN 10  CREATININE 0.76  CALCIUM 8.5*   Liver Function Tests: Recent Labs  Lab 07/06/18 1218  AST 20  ALT 14  ALKPHOS 46  BILITOT 0.7  PROT 6.0*  ALBUMIN 3.6   Recent Labs  Lab 07/06/18 1218  LIPASE 24   No results for input(s): AMMONIA in the last 168 hours. CBC: Recent Labs  Lab 07/06/18 1218  WBC 6.7  HGB 12.6  HCT 40.9  MCV 83.5  PLT 203   Cardiac Enzymes: No results for input(s): CKTOTAL, CKMB, CKMBINDEX, TROPONINI in the last 168 hours. BNP: Invalid input(s): POCBNP CBG: No results for input(s): GLUCAP in the last 168 hours. D-Dimer No results for input(s): DDIMER in the last 72 hours. Hgb A1c No results for input(s): HGBA1C in the last 72 hours. Lipid Profile No results for input(s): CHOL, HDL, LDLCALC, TRIG, CHOLHDL, LDLDIRECT in the last 72 hours. Thyroid function studies No results for input(s): TSH, T4TOTAL, T3FREE, THYROIDAB in the last 72 hours.  Invalid input(s): FREET3 Anemia work up No results for input(s): VITAMINB12, FOLATE, FERRITIN, TIBC, IRON, RETICCTPCT in  the last 72 hours. Urinalysis    Component Value Date/Time   BILIRUBINUR negative 09/30/2017 0931   PROTEINUR negative 09/30/2017 0931   UROBILINOGEN 0.2 09/30/2017 0931   NITRITE negative 09/30/2017 0931   LEUKOCYTESUR Small (1+) (A) 09/30/2017 0931   Sepsis Labs Invalid input(s): PROCALCITONIN,  WBC,  LACTICIDVEN Microbiology No results found for this or any previous visit (from the past 240 hour(s)).  Please note: You were cared for by a hospitalist during your hospital stay. Once you are discharged, your primary care physician will handle any further medical issues.   Time coordinating discharge: 40 minutes  SIGNED:  Flora Lipps, MD  [facility] 07/07/2018, 1:57 PM

## 2018-07-10 NOTE — Progress Notes (Signed)
[facility] [address] [place], [place] [address] [phone]  Date:  07/14/2018   Name:  Sheryl Bryan   DOB:  Dec 29, 1960   MRN:  [id number]  PCP:  Darreld Mclean, MD    Chief Complaint: Hospitalization Follow[place]mitted over night, vertigo, inner ear inflammation)   History of Present Illness:  Sheryl Bryan is a 57 y.o. very pleasant female patient who presents with the following: Last Sunday she was visiting her dad in [place]  This is when she developed the dizziness first. She went to the ER and was dx with vertigo, was given meclizine.  Her husband came to [place] and picked her up.  She continued to have difficult so she went to the ER and ended up being admitted Following up from this hospital admission today.  She was admitted for couple of days with dizziness thought due to vestibular neuronitis  Admit date: 07/06/2018 Discharge date: 07/07/2018  This is a 57 year old female with no significant medical history presented to the hospital with dizziness. In the emergency department, vital signs remarkable for normal heart rate, systolic blood pressure 295M.Labs remarkable for normal CBC, BMP with potassium of 3.4. Pregnancy test was negative. Lipase normal. CT angiogram with contrast revealed right internal carotid artery origin with fibrofatty plaque, moderate stenosis. Neurology was consulted who believed that the patient's vertigo is peripheral in nature and recommended course of prednisone with taper.  Of note, initially the emergency medicine team ordered an MRI of the brain, however this was unable to be performed due to incompatible substance holding her hair in place (metal).  Patient was seen by physical therapy who recommended vestibular rehab on discharge.  Patient symptomatically feels better she has been considered stable for disposition home with steroid taper for  possible vestibular neuronitis.  She was advised to follow-up with her primary care physician and potentially get a ENT referral if vertigo persists.  I also spoke with the patient's family at bedside regarding the plan for disposition. Discharge Diagnoses:  Active Problems:   Dizziness, likely vestibular neuronitis  Sheryl Bryan is here today accompanied by her husband.  She is riding in a wheelchair, as is a long walk back to my exam rooms.  However she reports that she is able to walk in her home. She had an episode of vomiting last night but she thinks she over- did it.  Otherwise she is no longer vomiting.  She did have vomiting at the beginning of this whole episode. She is now able to walk slowly, is getting her functionality back She has 2 days of prednisone left And shown her husband note that she is able to do more activity day by day.  However her progress is somewhat slow, and she admits to being frustrated.  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  She is not having headaches, but notes a feeling of pressure in her head.  She also has other symptoms commonly seen with vertigo or head injury, including a feeling of mental slowness, difficulty with using computers or iPhone, and some difficulty processing visual information.  We discussed the finding of a right internal carotid plaque on her CTA.  An MRI was not done at that time due to a metallic earpiece, but she has since removed this accessory.  We are not sure if any further intervention is  needed here.  Discussed options, and I plan to send a message to 1 of the interventional radiology physicians about her case., Patient Active Problem List   Diagnosis Date Noted  . Dizziness 07/06/2018  . Low back pain 08/05/2017  . Pre-diabetes 05/21/2017  . Vitamin D deficiency 05/21/2017  . Insomnia 03/27/2016  . Atypical ductal hyperplasia of right breast 08/22/2015  . Post-operative state 09/20/2012    Past  Medical History:  Diagnosis Date  . GERD (gastroesophageal reflux disease)    occ  . Papilloma of breast   . UTI (urinary tract infection)     Past Surgical History:  Procedure Laterality Date  . BREAST LUMPECTOMY WITH RADIOACTIVE SEED LOCALIZATION Right 09/11/2016   Procedure: RIGHT BREAST LUMPECTOMY WITH RADIOACTIVE SEED LOCALIZATION;  Surgeon: Erroll Luna, MD;  Location: [facility];  Service: General;  Laterality: Right;  . CESAREAN SECTION  12/04/93, 01/05/79  . ENDOMETRIAL ABLATION  2008  . FRACTURE SURGERY     rt pinkie toe  . PARTIAL MASTECTOMY WITH NEEDLE LOCALIZATION  09/01/2012   Procedure: PARTIAL MASTECTOMY WITH NEEDLE LOCALIZATION;  Surgeon: Marcello Moores A. Cornett, MD;  Location: [facility];  Service: General;  Laterality: Right;    Social History   Tobacco Use  . Smoking status: Never Smoker  . Smokeless tobacco: Never Used  Substance Use Topics  . Alcohol use: Yes    Comment: occasional  . Drug use: No    Family History  Problem Relation Age of Onset  . Cancer Mother        multiple myeloma  . Hypertension Mother   . Cancer Brother        leukemia  . Cancer Maternal Aunt        breast  . Cancer Maternal Aunt   . COPD Maternal Aunt        breast - great aunt    Allergies  Allergen Reactions  . Ciprofloxacin Nausea And Vomiting  . Sulfa Antibiotics Hives    Spreads all over the body    Medication list has been reviewed and updated.  Current Outpatient Medications on File Prior to Visit  Medication Sig Dispense Refill  . Cholecalciferol (VITAMIN D-3) 1000 units CAPS Take 1 capsule by mouth daily.    Marland Kitchen ibuprofen (ADVIL,MOTRIN) 200 MG tablet Take 600 mg by mouth every 6 (six) hours as needed for headache or moderate pain.    . meclizine (ANTIVERT) 25 MG tablet Take 25 mg by mouth 3 (three) times daily as needed for dizziness.    . ondansetron (ZOFRAN) 4 MG tablet Take 1 tablet (4 mg total) by mouth daily as needed for nausea or vomiting. 30 tablet 1  . predniSONE  (DELTASONE) 10 MG tablet prednisone 28m daily x 4 days, followed by 569mx1 day, then 4044m1 day, then 53m27m1 day, then 20mg62m day, then 10mg 42mday, then stop. 40 tablet 0  . valACYclovir (VALTREX) 500 MG tablet Take 1 tablet (500 mg total) by mouth 2 (two) times daily as needed. For outbreaks 30 tablet 2   No current facility-administered medications on file prior to visit.     Review of Systems:  As per HPI- otherwise negative.   Physical Examination: Vitals:   07/14/18 1153  BP: 132/80  Pulse: 83  Resp: 16  SpO2: 97%   Vitals:   There is no height or weight on file to calculate BMI. Ideal Body Weight:    GEN: WDWN, NAD, Non-toxic, A &  O x 3, looks well, normal weight. HEENT: Atraumatic, Normocephalic. Neck supple. No masses, No LAD. Ears and Nose: No external deformity. CV: RRR, No M/G/R. No JVD. No thrill. No extra heart sounds. PULM: CTA B, no wheezes, crackles, rhonchi. No retractions. No resp. distress. No accessory muscle use. EXTR: No c/c/e NEURO Normal gait.  PSYCH: Normally interactive. Conversant. Not depressed or anxious appearing.  Calm demeanor.  Normal strength and sensation of all of her limbs.  Normal facial movement and sensation.  Had patient stand up and she is able to walk, albeit somewhat slowly.  Normal Romberg.  She is able to perform tandem stance with her eyes closed for short period of time.  Assessment and Plan: Vestibular neuronitis, unspecified laterality - Plan: Ambulatory referral to [facility]  Following up from hospital admission for vestibular neuronitis.  Annet is making progress and seems to getting better.  She has 2 days more prednisone.  She is not working right now, and knows not to drive until she is feeling significantly better.   We discussed rereferral to neurology, but as she seems to making progress Lanise would prefer to wait and see how she does in recovery.   We will set her up for some home physical therapy to work  on balance I will consult with IR regarding her recent CTA, and we can order an MRI if need be.  It is also possible that IR may recommend a neurology referral. I have asked Carlette to come back and see me in 2 to 4 weeks, unless her symptoms are resolved and she no longer needs help. We discussed graded return to activity, and the need to take breaks when she feels tired.  Signed Lamar Blinks, MD

## 2018-07-14 ENCOUNTER — Ambulatory Visit: Payer: BLUE CROSS/BLUE SHIELD | Admitting: Family Medicine

## 2018-07-14 ENCOUNTER — Encounter: Payer: Self-pay | Admitting: Family Medicine

## 2018-07-14 VITALS — BP 132/80 | HR 83 | Resp 16

## 2018-07-14 DIAGNOSIS — H8120 Vestibular neuronitis, unspecified ear: Secondary | ICD-10-CM | POA: Diagnosis not present

## 2018-07-14 NOTE — Patient Instructions (Signed)
It was good to see you today- rest, but you can do activities as tolerated.   I will order home physical therapy for you, to work on balance.  I will also send a message to one of the interventional radiologists about your CT neck.  I will let you know what he has to say. As long as you feel you are making gradual progress, I think we are on the right track.  However if you feel like you start making progress or you are just not sure do not hesitate to contact me. I am glad to fill out FMLA paperwork or other paperwork for your job as needed. Let us plan to visit in 2 to 4 weeks.  However, if you are well you do not have to come back

## 2018-07-16 ENCOUNTER — Telehealth: Payer: Self-pay | Admitting: Family Medicine

## 2018-07-16 NOTE — Telephone Encounter (Signed)
Copied from [facility] [id number]. Topic: Quick Communication - Home Health Verbal Orders >> Jul 16, 2018  4:00 PM Margot Ables wrote: Caller/Agency: Lennette Bihari therapy clinical mgr w/[facility] Callback Number: [phone], secure VM if not available Requesting OT/PT/Skilled Nursing/Social Work: PT Frequency: notify of delay in PT start of care - there were insurance requirements that had to be figured out - pt will be seen Monday 12/23 - no cal back required - ok to call with questions

## 2018-07-19 DIAGNOSIS — H8120 Vestibular neuronitis, unspecified ear: Secondary | ICD-10-CM | POA: Diagnosis not present

## 2018-07-19 DIAGNOSIS — M545 Low back pain: Secondary | ICD-10-CM | POA: Diagnosis not present

## 2018-07-26 DIAGNOSIS — H8120 Vestibular neuronitis, unspecified ear: Secondary | ICD-10-CM | POA: Diagnosis not present

## 2018-07-26 DIAGNOSIS — M545 Low back pain: Secondary | ICD-10-CM | POA: Diagnosis not present

## 2018-08-02 ENCOUNTER — Other Ambulatory Visit (HOSPITAL_COMMUNITY): Payer: Self-pay | Admitting: Interventional Radiology

## 2018-08-02 DIAGNOSIS — I771 Stricture of artery: Secondary | ICD-10-CM

## 2018-08-03 NOTE — Progress Notes (Signed)
[facility] [address] [place], [place] [address] [phone]  Date:  08/04/2018   Name:  Sheryl Bryan   DOB:  1960-09-20   MRN:  [id number]  PCP:  Sheryl Mclean, MD    Chief Complaint: Follow-up (worse when turning to quickly, feeling some better)   History of Present Illness:  Sheryl Bryan is a 58 y.o. very pleasant female patient who presents with the following:  Seen by myself on 12/18-she had been admitted overnight from 12/10 to 12/11 of 2019 with vestibular neuronitis.  At our most recent visit Sheryl Bryan was making progress, but was still quite debilitated.  She was able to walk slowly but was using 1 of our wheelchairs to get to my office.  We ordered home physical therapy to work on her balance, and Sheryl Bryan preferred to hold off on neurology referral at that time. I have asked her to visit me in 2 to 4 weeks, to see how she was doing which is why she is here today.  I had also reached out interventional radiology about this patient, and they had plan to contact her for an evaluation due to a possibly unstable plaque seen in her right ICA  CTA NECK:  1. RIGHT ICA origin fibrofatty plaque may be unstable.  2. No hemodynamically significant stenosis. Patent vertebral  arteries.  3. LEFT C3-4 neural foraminal narrowing   She did drive a couple of days ago- this went ok but she is not feeling 100% yet.  She has not tried going out on the highway yet, she just drove on secondary roads Her job is a 30 minute drive away.  She does not feel that she is quite ready to go back to this drive yet  She is able to do her ADLs independently again, she just has to move slowly.  Activities which involve moving her head a lot, such as emptying the dishwasher, can be more problematic No vomiting Minor headaches- nothing serious Using a computer for a long time might cause headaches Nausea is much better  PT has her  doing exercises for her eye tracking, balance  She is able to read ok, tracks along the page ok   Her mood has been a bit down/ feeling frustrated as she is ready to get back to her normal  She is having an IR consultation next week  We discussed returning to work. The week of January 13, she would like to try doing half days from home. She would like to try to return to full work on the week of the 20th.  However, we understand that this plan may need to change depending on her symptoms.  The main concern is her driving to her job  She has been out of work since 12/9  Her husband is here today contributes to the history Patient Active Problem List   Diagnosis Date Noted  . Dizziness 07/06/2018  . Low back pain 08/05/2017  . Pre-diabetes 05/21/2017  . Vitamin D deficiency 05/21/2017  . Insomnia 03/27/2016  . Atypical ductal hyperplasia of right breast 08/22/2015  . Post-operative state 09/20/2012    Past Medical History:  Diagnosis Date  . GERD (gastroesophageal reflux disease)    occ  . Papilloma of breast   . UTI (urinary tract infection)     Past Surgical History:  Procedure Laterality Date  . BREAST LUMPECTOMY WITH RADIOACTIVE SEED LOCALIZATION Right 09/11/2016  Procedure: RIGHT BREAST LUMPECTOMY WITH RADIOACTIVE SEED LOCALIZATION;  Surgeon: Erroll Luna, MD;  Location: [facility];  Service: General;  Laterality: Right;  . CESAREAN SECTION  12/04/93, 01/05/79  . ENDOMETRIAL ABLATION  2008  . FRACTURE SURGERY     rt pinkie toe  . PARTIAL MASTECTOMY WITH NEEDLE LOCALIZATION  09/01/2012   Procedure: PARTIAL MASTECTOMY WITH NEEDLE LOCALIZATION;  Surgeon: Marcello Moores A. Cornett, MD;  Location: [facility];  Service: General;  Laterality: Right;    Social History   Tobacco Use  . Smoking status: Never Smoker  . Smokeless tobacco: Never Used  Substance Use Topics  . Alcohol use: Yes    Comment: occasional  . Drug use: No    Family History  Problem Relation Age of Onset  . Cancer  Mother        multiple myeloma  . Hypertension Mother   . Cancer Brother        leukemia  . Cancer Maternal Aunt        breast  . Cancer Maternal Aunt   . COPD Maternal Aunt        breast - great aunt    Allergies  Allergen Reactions  . Ciprofloxacin Nausea And Vomiting  . Sulfa Antibiotics Hives    Spreads all over the body    Medication list has been reviewed and updated.  Current Outpatient Medications on File Prior to Visit  Medication Sig Dispense Refill  . Cholecalciferol (VITAMIN D-3) 1000 units CAPS Take 1 capsule by mouth daily.    Marland Kitchen ibuprofen (ADVIL,MOTRIN) 200 MG tablet Take 600 mg by mouth every 6 (six) hours as needed for headache or moderate pain.    . meclizine (ANTIVERT) 25 MG tablet Take 25 mg by mouth 3 (three) times daily as needed for dizziness.    . valACYclovir (VALTREX) 500 MG tablet Take 1 tablet (500 mg total) by mouth 2 (two) times daily as needed. For outbreaks 30 tablet 2   No current facility-administered medications on file prior to visit.     Review of Systems:  As per HPI- otherwise negative.  Rarely using meclizine, not in the last couple of weeks   Physical Examination: Vitals:   08/04/18 1101  BP: 132/78  Pulse: 94  Resp: 16  SpO2: 96%   Vitals:   08/04/18 1101  Weight: 165 lb (74.8 kg)  Height: 5' 3" (1.6 m)   Body mass index is 29.23 kg/m. Ideal Body Weight: Weight in (lb) to have BMI = 25: 140.8  GEN: WDWN, NAD, Non-toxic, A & O x 3, overweight, appears well HEENT: Atraumatic, Normocephalic. Neck supple. No masses, No LAD. Ears and Nose: No external deformity. CV: RRR, No M/G/R. No JVD. No thrill. No extra heart sounds. PULM: CTA B, no wheezes, crackles, rhonchi. No retractions. No resp. distress. No accessory muscle use. EXTR: No c/c/e NEURO Normal gait.  Romberg is normal today.  She still has difficulty with tandem stance with eyes closed PSYCH: Normally interactive. Conversant. Not depressed or anxious appearing.   Calm demeanor.    Assessment and Plan: Vestibular neuronitis, unspecified laterality  Following up on her vestibular neuronitis today Sheryl Bryan is making good progress, she is significantly improved but not yet at her baseline. I completed FMLA paperwork for her today, and gave the completed paperwork to [facility]. She is seeing interventional radiology next week for an evaluation. She is continue doing physical therapy for now, and is noticing steady progress. Went over guidelines as far as increasing her  activities, and increasing her driving. I advised her to only drive if she feels comfortable, and did not push herself to return to however driving for she feels ready She hopes to return to work on a part-time basis from home as of next week.   Signed Lamar Blinks, MD

## 2018-08-04 ENCOUNTER — Ambulatory Visit: Payer: BLUE CROSS/BLUE SHIELD | Admitting: Family Medicine

## 2018-08-04 ENCOUNTER — Telehealth: Payer: Self-pay | Admitting: *Deleted

## 2018-08-04 ENCOUNTER — Encounter: Payer: Self-pay | Admitting: Family Medicine

## 2018-08-04 VITALS — BP 132/78 | HR 94 | Resp 16 | Ht 63.0 in | Wt 165.0 lb

## 2018-08-04 DIAGNOSIS — H8120 Vestibular neuronitis, unspecified ear: Secondary | ICD-10-CM | POA: Diagnosis not present

## 2018-08-04 NOTE — Patient Instructions (Signed)
I am so glad that you are doing better.  Continue doing your physical therapy, you may want to do the exercises for a while even after your therapist is finished treating you. I think your plan for returning to work is a good one.  I will complete your FMLA paperwork and fax it in for you.  However, if you are not ready to start driving to work on the 20th that is okay, we can always reassess Do not push yourself to drive on the highway before you are ready Also, you may find that you get tired more easily than is normal for you.  This is part of your recovery, however pushing yourself to the point exhaustion may cause a setback.  Please keep this in mind  Take care and let me know if you are not back to about normal by the end of this month

## 2018-08-04 NOTE — Telephone Encounter (Signed)
Received completed FMLA paperwork for patient from provider; faxed to [employer] at [phone] with confirmation, provider aware/SLS 01/08

## 2018-08-10 ENCOUNTER — Ambulatory Visit (HOSPITAL_COMMUNITY)
Admission: RE | Admit: 2018-08-10 | Discharge: 2018-08-10 | Disposition: A | Discharge status: Home / Self Care | Payer: BLUE CROSS/BLUE SHIELD | Source: Ambulatory Visit | Attending: Interventional Radiology | Admitting: Interventional Radiology

## 2018-08-10 DIAGNOSIS — H8120 Vestibular neuronitis, unspecified ear: Secondary | ICD-10-CM | POA: Diagnosis not present

## 2018-08-10 DIAGNOSIS — I771 Stricture of artery: Secondary | ICD-10-CM

## 2018-08-10 NOTE — Consult Note (Signed)
Chief Complaint: Patient was seen in consultation today for abnormal CTA after dx labyrinthits at the request of Dr Denny Peon   Supervising Physician: Luanne Bras  Patient Status: Sheryl Bryan  History of Present Illness: Sheryl Bryan is a 58 y.o. female   07/06/18 note: This is a 58 year old female with no significant medical history presented to the hospital with dizziness. In the emergency department, vital signs remarkable for normal heart rate, systolic blood pressure 161W.Labs remarkable for normal CBC, BMP with potassium of 3.4. Pregnancy test was negative. Lipase normal. CT angiogram with contrast revealed right internal carotid artery origin with fibrofatty plaque, moderate stenosis. Neurology was consulted who believed that the patient's vertigo is peripheral in nature and recommended course of prednisone with taper.  Of note, initially the emergency medicine team ordered an MRI of the brain, however this was unable to be performed due to incompatible substance holding her hair in place (metal).  Patient was seen by physical therapy who recommended vestibular rehab on discharge.  Patient symptomatically feels better she has been considered stable for disposition home with steroid taper for possible vestibular neuronitis.  She was advised to follow-up with her primary care physician and potentially get a ENT referral if vertigo persists.  I also spoke with the patient's family at bedside regarding the plan for disposition.  CTA 12/10: CTA NECK: 1. RIGHT ICA origin fibrofatty plaque may be unstable. 2. No hemodynamically significant stenosis. Patent vertebral arteries. 3. LEFT C3-4 neural foraminal narrowing. CTA HEAD: 1. No emergent large vessel occlusion or flow-limiting stenosis. 2. Moderate stenosis RIGHT P3 segment. Mild intracranial Atherosclerosis.  N/V and dizziness-- resolved Pt is much better after Pred taper  Continues PT at home Symptoms less  daily Still with some light headedness on occasion-- rare at this pont Back to work Monday. Denies headache; denies N/V No pain Nonsmoker Minimal use of alcohol  Scheduled today to discuss findings with Dr Estanislado Pandy and evaluation.   Past Medical History:  Diagnosis Date  . GERD (gastroesophageal reflux disease)    occ  . Papilloma of breast   . UTI (urinary tract infection)     Past Surgical History:  Procedure Laterality Date  . BREAST LUMPECTOMY WITH RADIOACTIVE SEED LOCALIZATION Right 09/11/2016   Procedure: RIGHT BREAST LUMPECTOMY WITH RADIOACTIVE SEED LOCALIZATION;  Surgeon: Erroll Luna, MD;  Location: [facility];  Service: General;  Laterality: Right;  . CESAREAN SECTION  12/04/93, 01/05/79  . ENDOMETRIAL ABLATION  2008  . FRACTURE SURGERY     rt pinkie toe  . PARTIAL MASTECTOMY WITH NEEDLE LOCALIZATION  09/01/2012   Procedure: PARTIAL MASTECTOMY WITH NEEDLE LOCALIZATION;  Surgeon: Marcello Moores A. Cornett, MD;  Location: [facility];  Service: General;  Laterality: Right;    Allergies: Ciprofloxacin and Sulfa antibiotics  Medications: Prior to Admission medications   Medication Sig Start Date End Date Taking? Authorizing Provider  Cholecalciferol (VITAMIN D-3) 1000 units CAPS Take 1 capsule by mouth daily.    [provider]  ibuprofen (ADVIL,MOTRIN) 200 MG tablet Take 600 mg by mouth every 6 (six) hours as needed for headache or moderate pain.    [provider]  meclizine (ANTIVERT) 25 MG tablet Take 25 mg by mouth 3 (three) times daily as needed for dizziness.    [provider]  valACYclovir (VALTREX) 500 MG tablet Take 1 tablet (500 mg total) by mouth 2 (two) times daily as needed. For outbreaks 06/03/18   Copland, Gay Filler, MD  Family History  Problem Relation Age of Onset  . Cancer Mother        multiple myeloma  . Hypertension Mother   . Cancer Brother        leukemia  . Cancer Maternal Aunt        breast  . Cancer Maternal Aunt   . COPD  Maternal Aunt        breast - great aunt    Social History   Socioeconomic History  . Marital status: Married    Spouse name: Not on file  . Number of children: Not on file  . Years of education: Not on file  . Highest education level: Not on file  Occupational History  . Not on file  Social Needs  . Financial resource strain: Not very hard  . Food insecurity:    Worry: Patient refused    Inability: Patient refused  . Transportation needs:    Medical: Patient refused    Non-medical: Patient refused  Tobacco Use  . Smoking status: Never Smoker  . Smokeless tobacco: Never Used  Substance and Sexual Activity  . Alcohol use: Yes    Comment: occasional  . Drug use: No  . Sexual activity: Not on file  Lifestyle  . Physical activity:    Days per week: Patient refused    Minutes per session: Patient refused  . Stress: Only a little  Relationships  . Social connections:    Talks on phone: More than three times a week    Gets together: More than three times a week    Attends religious service: More than 4 times per year    Active member of club or organization: Yes    Attends meetings of clubs or organizations: More than 4 times per year    Relationship status: Married  Other Topics Concern  . Not on file  Social History Narrative  . Not on file    Review of Systems: A 12 point ROS discussed and pertinent positives are indicated in the HPI above.  All other systems are negative.  Review of Systems  Constitutional: Negative for activity change, appetite change, fatigue and fever.  HENT: Negative for hearing loss, rhinorrhea, sore throat, tinnitus and trouble swallowing.   Eyes: Negative for visual disturbance.  Respiratory: Negative for cough and shortness of breath.   Cardiovascular: Negative for chest pain.  Gastrointestinal: Negative for abdominal pain, nausea and vomiting.  Musculoskeletal: Negative for back pain and gait problem.  Neurological: Positive for  light-headedness. Negative for dizziness, tremors, seizures, syncope, facial asymmetry, speech difficulty, weakness, numbness and headaches.  Psychiatric/Behavioral: Negative for behavioral problems, confusion and decreased concentration.    Vital Signs: There were no vitals taken for this visit.  Physical Exam Vitals signs reviewed.  Constitutional:      Appearance: Normal appearance.  HENT:     Head: Atraumatic.  Eyes:     Extraocular Movements: Extraocular movements intact.  Neck:     Musculoskeletal: Normal range of motion.  Musculoskeletal: Normal range of motion.  Skin:    General: Skin is warm and dry.  Neurological:     General: No focal deficit present.     Mental Status: She is alert and oriented to person, place, and time.  Psychiatric:        Mood and Affect: Mood normal.        Behavior: Behavior normal.        Thought Content: Thought content normal.  Judgment: Judgment normal.     Imaging: No results found.  Labs:  CBC: Recent Labs    06/03/18 0946 07/06/18 1218  WBC 3.9* 6.7  HGB 12.8 12.6  HCT 39.5 40.9  PLT 212.0 203    COAGS: No results for input(s): INR, APTT in the last 8760 hours.  BMP: Recent Labs    06/03/18 0946 07/06/18 1218  NA 142 142  K 3.8 3.4*  CL 106 109  CO2 31 24  GLUCOSE 83 141*  BUN 12 10  CALCIUM 9.2 8.5*  CREATININE 0.77 0.76  GFRNONAA  --  >60  GFRAA  --  >60    LIVER FUNCTION TESTS: Recent Labs    06/03/18 0946 07/06/18 1218  BILITOT 0.4 0.7  AST 15 20  ALT 10 14  ALKPHOS 54 46  PROT 6.3 6.0*  ALBUMIN 4.4 3.6    TUMOR MARKERS: No results for input(s): AFPTM, CEA, CA199, CHROMGRNA in the last 8760 hours.  Assessment and Plan:  Dr Estanislado Pandy reviewed imaging with pt and husband Answered all questions to satisfaction Imaging does not show findings that he feels contribute to these resolving symptoms. Would recommend MRI Brain to determine any reason for symptoms; ie...dissection;  etc.. We will schedule this at her earliest convenience at [facility] She is agreeable to plan Follow up with Dr Estanislado Pandy sometime after MRI   Thank you for this interesting consult.  I greatly enjoyed meeting Sheryl Bryan and look forward to participating in their care.  A copy of this report was sent to the requesting provider on this date.  Electronically Signed: Lavonia Drafts, Sheryl Bryan 08/10/2018, 2:33 PM   I spent a total of  30 Minutes   in face to face in clinical consultation, greater than 50% of which was counseling/coordinating care for vestibular neuronitis

## 2018-08-11 DIAGNOSIS — Z113 Encounter for screening for infections with a predominantly sexual mode of transmission: Secondary | ICD-10-CM | POA: Diagnosis not present

## 2018-08-11 DIAGNOSIS — Z1151 Encounter for screening for human papillomavirus (HPV): Secondary | ICD-10-CM | POA: Diagnosis not present

## 2018-08-11 DIAGNOSIS — Z1231 Encounter for screening mammogram for malignant neoplasm of breast: Secondary | ICD-10-CM | POA: Diagnosis not present

## 2018-08-11 DIAGNOSIS — Z6825 Body mass index (BMI) 25.0-25.9, adult: Secondary | ICD-10-CM | POA: Diagnosis not present

## 2018-08-11 DIAGNOSIS — Z01419 Encounter for gynecological examination (general) (routine) without abnormal findings: Secondary | ICD-10-CM | POA: Diagnosis not present

## 2018-08-11 DIAGNOSIS — R300 Dysuria: Secondary | ICD-10-CM | POA: Diagnosis not present

## 2018-08-16 ENCOUNTER — Telehealth: Payer: Self-pay | Admitting: *Deleted

## 2018-08-16 ENCOUNTER — Telehealth (HOSPITAL_COMMUNITY): Payer: Self-pay

## 2018-08-16 ENCOUNTER — Other Ambulatory Visit (HOSPITAL_COMMUNITY): Payer: Self-pay | Admitting: Interventional Radiology

## 2018-08-16 DIAGNOSIS — I771 Stricture of artery: Secondary | ICD-10-CM

## 2018-08-16 NOTE — Telephone Encounter (Signed)
Called to schedule f/u mri, no answer, left vm. AW 

## 2018-08-16 NOTE — Telephone Encounter (Signed)
Received Physician Orders from [facility]; forwarded to provider/SLS 01/20

## 2018-08-18 ENCOUNTER — Ambulatory Visit (HOSPITAL_COMMUNITY)
Admission: RE | Admit: 2018-08-18 | Discharge: 2018-08-18 | Disposition: A | Discharge status: Home / Self Care | Payer: BLUE CROSS/BLUE SHIELD | Source: Ambulatory Visit | Attending: Interventional Radiology | Admitting: Interventional Radiology

## 2018-08-18 DIAGNOSIS — R42 Dizziness and giddiness: Secondary | ICD-10-CM | POA: Diagnosis not present

## 2018-08-18 DIAGNOSIS — I771 Stricture of artery: Secondary | ICD-10-CM | POA: Insufficient documentation

## 2018-08-23 ENCOUNTER — Telehealth (HOSPITAL_COMMUNITY): Payer: Self-pay

## 2018-08-23 NOTE — Telephone Encounter (Signed)
Called pt regarding recent mri, no answer, left vm. AW 

## 2018-08-24 ENCOUNTER — Telehealth (HOSPITAL_COMMUNITY): Payer: Self-pay

## 2018-08-24 NOTE — Telephone Encounter (Signed)
Pt agreed to f/u in 4 months with cta head/neck. AW 

## 2018-08-25 ENCOUNTER — Telehealth: Payer: Self-pay | Admitting: *Deleted

## 2018-08-25 NOTE — Telephone Encounter (Signed)
Received Discharge Summary from [facility] for review; forwarded to provider/SLS 01/29

## 2018-08-26 DIAGNOSIS — R300 Dysuria: Secondary | ICD-10-CM | POA: Diagnosis not present

## 2018-09-13 ENCOUNTER — Encounter: Payer: Self-pay | Admitting: Family Medicine

## 2018-09-13 DIAGNOSIS — M25551 Pain in right hip: Secondary | ICD-10-CM

## 2018-09-13 DIAGNOSIS — B009 Herpesviral infection, unspecified: Secondary | ICD-10-CM

## 2018-09-13 MED ORDER — MELOXICAM 7.5 MG PO TABS: 7.5000 mg | ORAL_TABLET | Freq: Every day | ORAL | 6 refills | Status: AC

## 2018-09-13 MED ORDER — VALACYCLOVIR HCL 500 MG PO TABS: 500.0000 mg | ORAL_TABLET | Freq: Two times a day (BID) | ORAL | 2 refills | Status: DC | PRN

## 2018-10-27 IMAGING — MG MM PLC BREAST LOC DEV 1ST LESION INC*R*
3 series · 3 of 3 positions shown · non-contrast
Comparison: Previous exam(s).

CLINICAL DATA: Preoperative radioactive seed localization, prior to
right breast lower inner quadrant excisional biopsy for ADH

EXAM:  .:
EXAM:  .
MAMMOGRAPHIC GUIDED RADIOACTIVE SEED LOCALIZATION OF THE RIGHT
BREAST

[R ML]
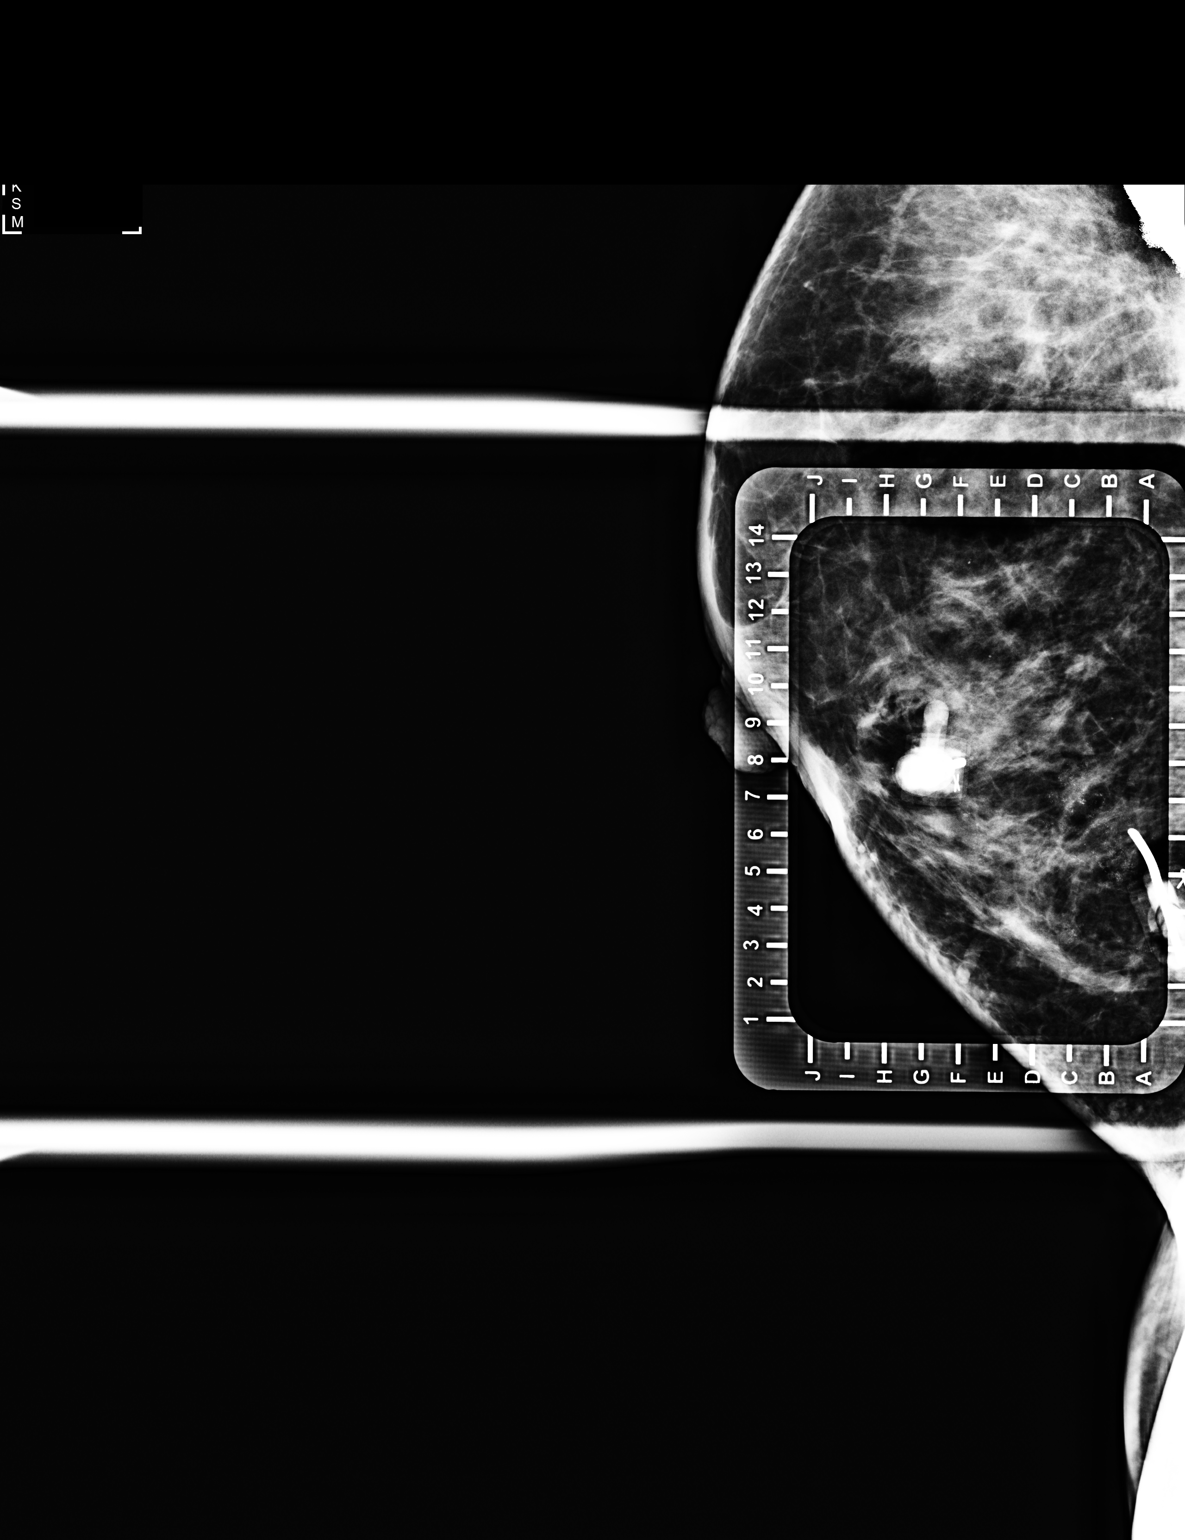

[R MLO]
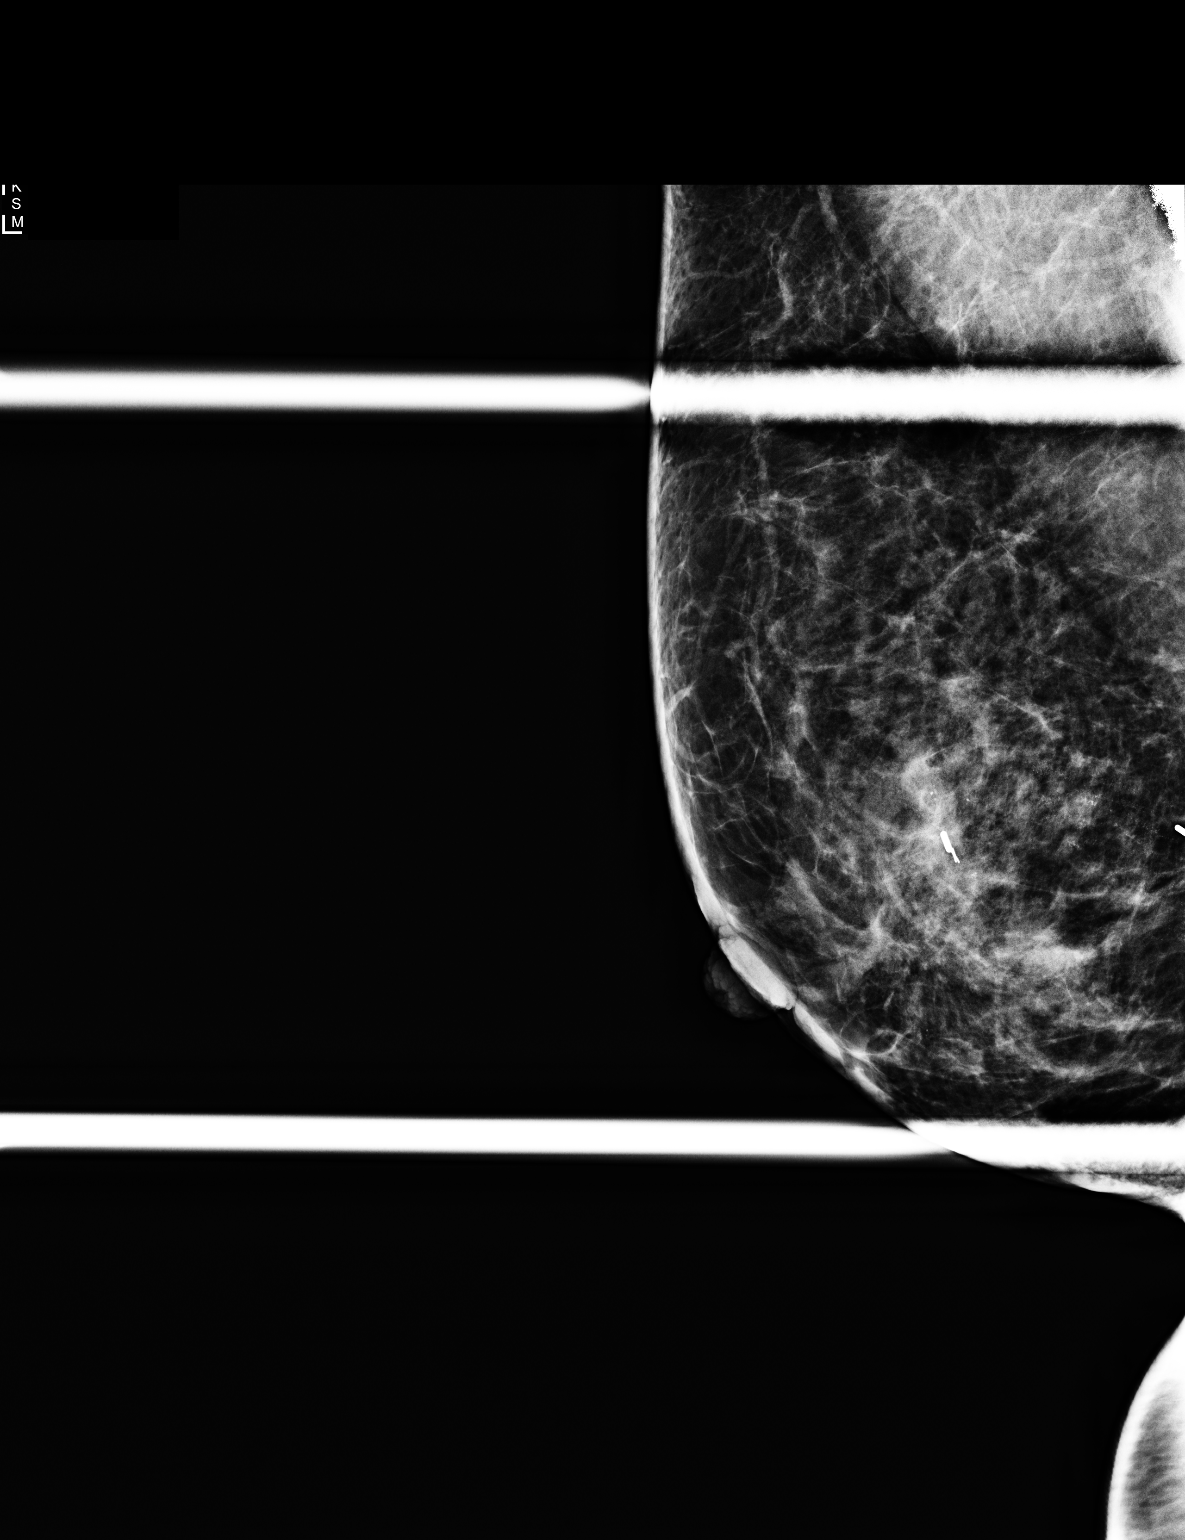

[R CC]
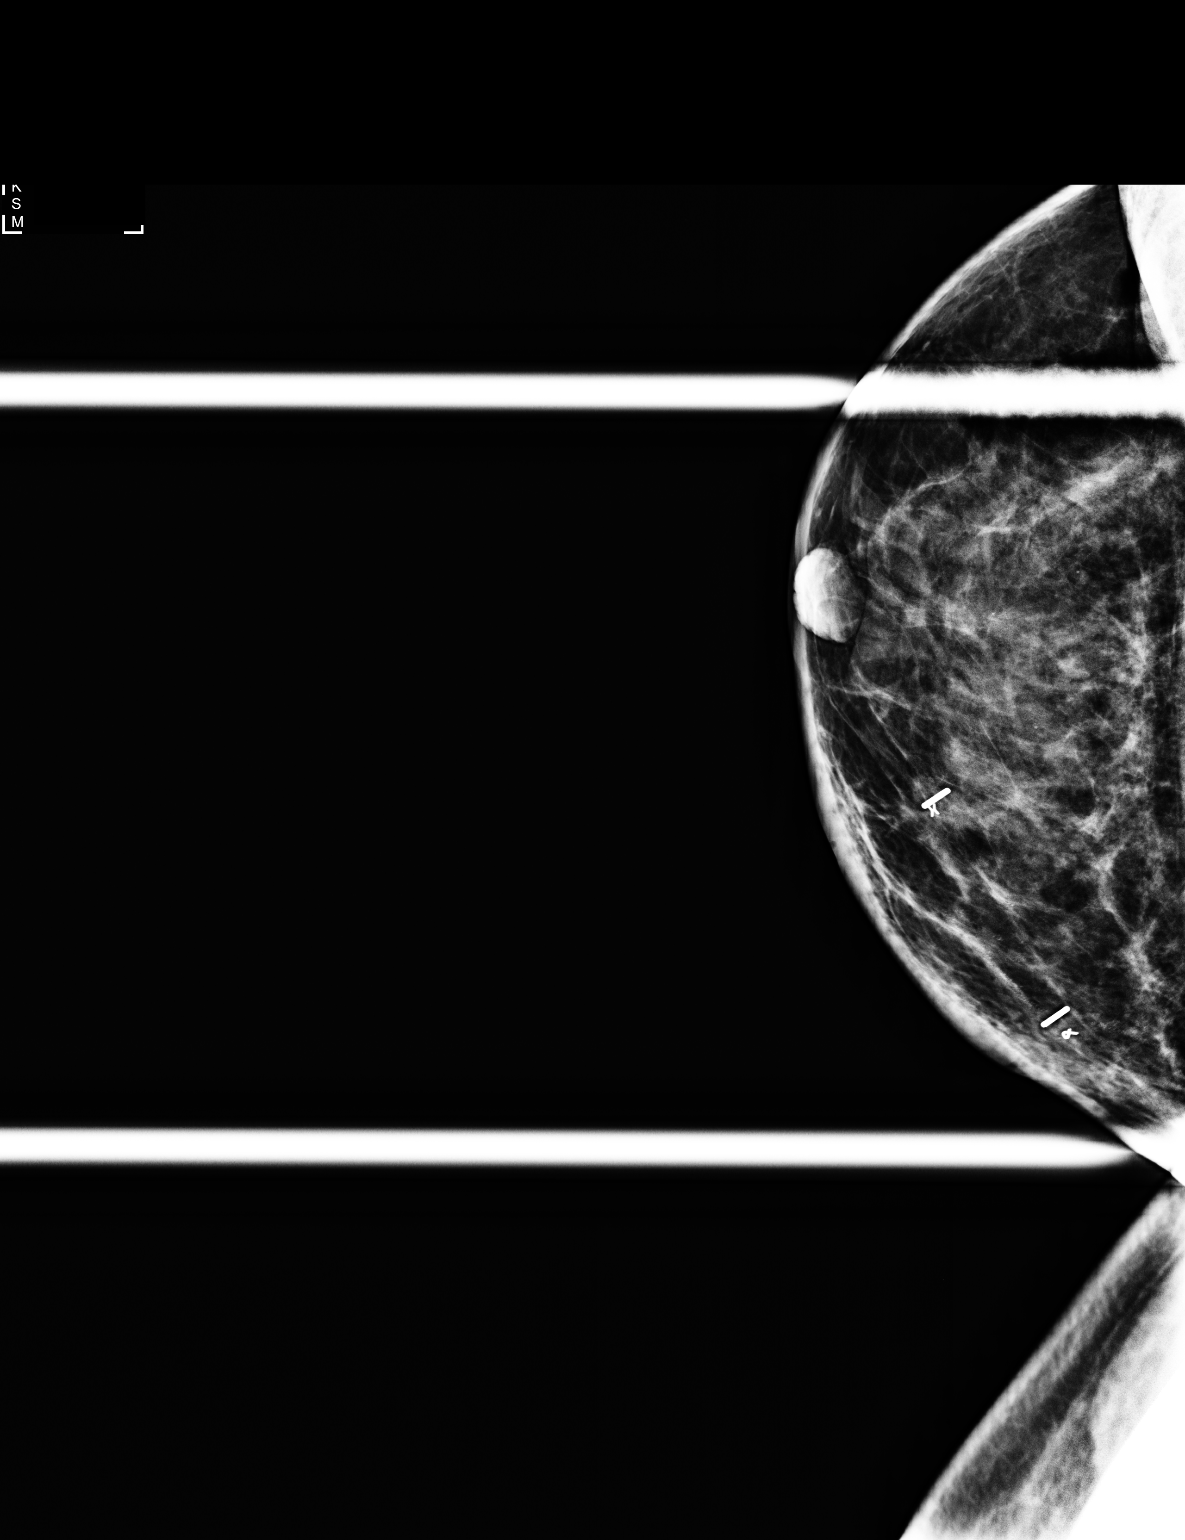

[3 of 3 positions shown; findings below may reference images not displayed]

FINDINGS: Patient presents for radioactive seed localization prior to right
breast excisional biopsy. I met with the patient and we discussed
the procedure of seed localization including benefits and
alternatives. We discussed the high likelihood of a successful
procedure. We discussed the risks of the procedure including
infection, bleeding, tissue injury and further surgery. We discussed
the low dose of radioactivity involved in the procedure. Informed,
written consent was given.

The usual time-out protocol was performed immediately prior to the
procedure.

Using mammographic guidance, sterile technique, 1% lidocaine and an
S-G62 radioactive seed, the ribbon shaped marker in the lower inner
quadrant of the right breast, posterior depth, marking the posterior
most visualized portion of the area of ADH was localized using a
medial approach.

Next, using mammographic guidance, sterile technique, 1% lidocaine
and an S-G62 radioactive seed, the X shaped marker in the lower
inner quadrant of the right breast, anterior depth, marking the
anterior most visualized portion of ADH was localized using a medial
approach. The 2 placed seeds are 3.5 cm apart.

The follow-up mammogram images confirm the seed in the expected
location and were marked for Dr. Alex Ivan.

Follow-up survey of the patient confirms presence of the radioactive
seeds.

Both seeds:

Order number of S-G62 seed:  [id number].

Total activity:  0.248 millicurie  Reference Date: August 22, 2016

The patient tolerated the procedure well and was released from the
[REDACTED]. She was given instructions regarding seed removal.
IMPRESSION: Radioactive seed localization using 2 seeds of the right breast. No
apparent complications.

## 2018-10-28 ENCOUNTER — Encounter: Payer: Self-pay | Admitting: Family Medicine

## 2018-10-30 NOTE — Progress Notes (Signed)
[place] at [facility] [address], [place], [place] [address] [phone]  Date:  11/01/2018   Name:  Sheryl Bryan   DOB:  1961/04/15   MRN:  [id number]  PCP:  Darreld Mclean, MD    Chief Complaint: No chief complaint on file.   History of Present Illness:  Sheryl Bryan is a 58 y.o. very pleasant female patient who presents with the following: Virtual visit today due to [place] outbreak . Pt ID confirmed with her name and DOB  History of pre-diabetes, breast cancer Here today with concern of sore throat that she noted as of the last 2 weeks, off and on It is a mild, scratchy and irritated feeling She does have some PND she thinks Sometimes she feels some discomfort with swallowing She tried to look in her throat, and it appears about the same as always Some sneezing, not a lot.   No fever She thinks this may be allergies due to pollen Mild dry cough No vomiting or diarrhea Her taste and smell sense are the same   She is now working at home- out of her sunroom, her husband has also set up a home office.  They are doing the best that they can!   The vestibular neuronitis from the winter is still resolved  She has tried some OTC remedies for her sx but no allergy meds   She takes meloxicam more regularly for hip and back pain.  Some morning she is limping some  She is trying to be more active- she is riding her bike and jumping rope, thinks that she might need to see ortho for her hip pain once this is available    Patient Active Problem List   Diagnosis Date Noted  . Dizziness 07/06/2018  . Low back pain 08/05/2017  . Pre-diabetes 05/21/2017  . Vitamin D deficiency 05/21/2017  . Insomnia 03/27/2016  . Atypical ductal hyperplasia of right breast 08/22/2015  . Post-operative state 09/20/2012    Past Medical History:  Diagnosis Date  . GERD (gastroesophageal reflux disease)    occ  . Papilloma of  breast   . UTI (urinary tract infection)     Past Surgical History:  Procedure Laterality Date  . BREAST LUMPECTOMY WITH RADIOACTIVE SEED LOCALIZATION Right 09/11/2016   Procedure: RIGHT BREAST LUMPECTOMY WITH RADIOACTIVE SEED LOCALIZATION;  Surgeon: Erroll Luna, MD;  Location: [facility];  Service: General;  Laterality: Right;  . CESAREAN SECTION  12/04/93, 01/05/79  . ENDOMETRIAL ABLATION  2008  . FRACTURE SURGERY     rt pinkie toe  . PARTIAL MASTECTOMY WITH NEEDLE LOCALIZATION  09/01/2012   Procedure: PARTIAL MASTECTOMY WITH NEEDLE LOCALIZATION;  Surgeon: Marcello Moores A. Cornett, MD;  Location: [facility];  Service: General;  Laterality: Right;    Social History   Tobacco Use  . Smoking status: Never Smoker  . Smokeless tobacco: Never Used  Substance Use Topics  . Alcohol use: Yes    Comment: occasional  . Drug use: No    Family History  Problem Relation Age of Onset  . Cancer Mother        multiple myeloma  . Hypertension Mother   . Cancer Brother        leukemia  . Cancer Maternal Aunt        breast  . Cancer Maternal Aunt   . COPD Maternal Aunt        breast -  great aunt    Allergies  Allergen Reactions  . Ciprofloxacin Nausea And Vomiting  . Sulfa Antibiotics Hives    Spreads all over the body    Medication list has been reviewed and updated.  Current Outpatient Medications on File Prior to Visit  Medication Sig Dispense Refill  . Cholecalciferol (VITAMIN D-3) 1000 units CAPS Take 1 capsule by mouth daily.    Marland Kitchen ibuprofen (ADVIL,MOTRIN) 200 MG tablet Take 600 mg by mouth every 6 (six) hours as needed for headache or moderate pain.    . meclizine (ANTIVERT) 25 MG tablet Take 25 mg by mouth 3 (three) times daily as needed for dizziness.    . meloxicam (MOBIC) 7.5 MG tablet Take 1 tablet (7.5 mg total) by mouth daily. Use as needed for joint pain 30 tablet 6  . valACYclovir (VALTREX) 500 MG tablet Take 1 tablet (500 mg total) by mouth 2 (two) times daily as needed. For  outbreaks 30 tablet 2   No current facility-administered medications on file prior to visit.     Review of Systems:  As per HPI- otherwise negative. No glaucoma history   Physical Examination: There were no vitals filed for this visit. There were no vitals filed for this visit. There is no height or weight on file to calculate BMI. Ideal Body Weight:   She is not checking vitals at home except for her temp which has been normal- 98.7 Pt observed on webex video. She looks well, her usual self.  No cough, wheezing, SOB or tahcypnea is observed.  No distress    Assessment and Plan: Allergic pharyngitis - Plan: montelukast (SINGULAIR) 10 MG tablet  PND (post-nasal drip) - Plan: ipratropium (ATROVENT) 0.03 % nasal spray  Right hip pain - Plan: Ambulatory referral to Orthopedic Surgery  Virtual visit today for likely allergies.  I prescribed atrovent nasal and singulair for her today. She will let me know if these medications are helpful I also referred her to orthopedics for further eval of her right hip pain   Signed Lamar Blinks, MD

## 2018-11-01 ENCOUNTER — Encounter: Payer: Self-pay | Admitting: Family Medicine

## 2018-11-01 ENCOUNTER — Other Ambulatory Visit: Payer: Self-pay

## 2018-11-01 ENCOUNTER — Ambulatory Visit (INDEPENDENT_AMBULATORY_CARE_PROVIDER_SITE_OTHER): Payer: BLUE CROSS/BLUE SHIELD | Admitting: Family Medicine

## 2018-11-01 DIAGNOSIS — J029 Acute pharyngitis, unspecified: Secondary | ICD-10-CM

## 2018-11-01 DIAGNOSIS — R0982 Postnasal drip: Secondary | ICD-10-CM

## 2018-11-01 DIAGNOSIS — M25551 Pain in right hip: Secondary | ICD-10-CM | POA: Diagnosis not present

## 2018-11-01 MED ORDER — MONTELUKAST SODIUM 10 MG PO TABS: 10.0000 mg | ORAL_TABLET | Freq: Every day | ORAL | 3 refills | Status: DC

## 2018-11-01 MED ORDER — IPRATROPIUM BROMIDE 0.03 % NA SOLN: 2.0000 | Freq: Four times a day (QID) | NASAL | 6 refills | Status: DC

## 2018-11-01 NOTE — Patient Instructions (Signed)
It was so nice to talk to you today! I sent the nasal spray and singulair tablet rx to your drug store for you to try. You can also certainly try something like clartin or zyrtec OTC in addition for allergies.    Please keep me posted about how you are doing.  I started a referral for you to be seen by orthopedics in [place] once things are back to normal!  Stay safe and let me know if you need anything

## 2018-11-18 ENCOUNTER — Encounter: Payer: Self-pay | Admitting: Family Medicine

## 2018-11-18 ENCOUNTER — Ambulatory Visit (INDEPENDENT_AMBULATORY_CARE_PROVIDER_SITE_OTHER): Payer: BLUE CROSS/BLUE SHIELD | Admitting: Family Medicine

## 2018-11-18 ENCOUNTER — Other Ambulatory Visit: Payer: Self-pay

## 2018-11-18 DIAGNOSIS — R300 Dysuria: Secondary | ICD-10-CM | POA: Diagnosis not present

## 2018-11-18 MED ORDER — NITROFURANTOIN MONOHYD MACRO 100 MG PO CAPS: 100.0000 mg | ORAL_CAPSULE | Freq: Two times a day (BID) | ORAL | 0 refills | Status: DC

## 2018-11-18 NOTE — Patient Instructions (Signed)
It was great to talk with you today,I  hope that you are feeling much better soon! We will treat you with Macrobid antibiotic for presumed UTI Drinking plenty of fluids to flush your bladder can also be helpful Please do let me know if you are not feeling better in the next couple of days, sooner if worse

## 2018-11-18 NOTE — Progress Notes (Signed)
[place] at [facility] [address], [place], [place] [address] [phone]  Date:  11/18/2018   Name:  Sheryl Bryan   DOB:  1961-04-23   MRN:  [id number]  PCP:  Darreld Mclean, MD    Chief Complaint: No chief complaint on file.   History of Present Illness:  Sheryl Bryan is a 58 y.o. very pleasant female patient who presents with the following:  Pt location is at home- she is working from home  Provider location is office Pt ID confirmed with name and DOB.  Consent given for virtual visit today Converted to phone visit as [email or web address] application has not been working well today  Virtual visit today for concern of UTI She has noted a burning with urination, urgency- feeling like she has to go again right after voiding  This started last night  No blood in her urine  No belly pain, no back pain, no vomiting or fever No vaginal sx noted - no unusual discharge   She is otherwise feeling well except for mild allergy symptoms  Patient Active Problem List   Diagnosis Date Noted  . Dizziness 07/06/2018  . Low back pain 08/05/2017  . Pre-diabetes 05/21/2017  . Vitamin D deficiency 05/21/2017  . Insomnia 03/27/2016  . Atypical ductal hyperplasia of right breast 08/22/2015  . Post-operative state 09/20/2012    Past Medical History:  Diagnosis Date  . GERD (gastroesophageal reflux disease)    occ  . Papilloma of breast   . UTI (urinary tract infection)     Past Surgical History:  Procedure Laterality Date  . BREAST LUMPECTOMY WITH RADIOACTIVE SEED LOCALIZATION Right 09/11/2016   Procedure: RIGHT BREAST LUMPECTOMY WITH RADIOACTIVE SEED LOCALIZATION;  Surgeon: Erroll Luna, MD;  Location: [facility];  Service: General;  Laterality: Right;  . CESAREAN SECTION  12/04/93, 01/05/79  . ENDOMETRIAL ABLATION  2008  . FRACTURE SURGERY     rt pinkie toe  . PARTIAL MASTECTOMY WITH NEEDLE LOCALIZATION  09/01/2012   Procedure:  PARTIAL MASTECTOMY WITH NEEDLE LOCALIZATION;  Surgeon: Marcello Moores A. Cornett, MD;  Location: [facility];  Service: General;  Laterality: Right;    Social History   Tobacco Use  . Smoking status: Never Smoker  . Smokeless tobacco: Never Used  Substance Use Topics  . Alcohol use: Yes    Comment: occasional  . Drug use: No    Family History  Problem Relation Age of Onset  . Cancer Mother        multiple myeloma  . Hypertension Mother   . Cancer Brother        leukemia  . Cancer Maternal Aunt        breast  . Cancer Maternal Aunt   . COPD Maternal Aunt        breast - great aunt    Allergies  Allergen Reactions  . Ciprofloxacin Nausea And Vomiting  . Sulfa Antibiotics Hives    Spreads all over the body    Medication list has been reviewed and updated.  Current Outpatient Medications on File Prior to Visit  Medication Sig Dispense Refill  . Cholecalciferol (VITAMIN D-3) 1000 units CAPS Take 1 capsule by mouth daily.    Marland Kitchen ibuprofen (ADVIL,MOTRIN) 200 MG tablet Take 600 mg by mouth every 6 (six) hours as needed for headache or moderate pain.    Marland Kitchen ipratropium (ATROVENT) 0.03 % nasal spray Place 2 sprays into the nose 4 (four)  times daily. Use as needed for post nasal drip 30 mL 6  . meclizine (ANTIVERT) 25 MG tablet Take 25 mg by mouth 3 (three) times daily as needed for dizziness.    . meloxicam (MOBIC) 7.5 MG tablet Take 1 tablet (7.5 mg total) by mouth daily. Use as needed for joint pain 30 tablet 6  . montelukast (SINGULAIR) 10 MG tablet Take 1 tablet (10 mg total) by mouth at bedtime. Use as needed for allergies 30 tablet 3  . valACYclovir (VALTREX) 500 MG tablet Take 1 tablet (500 mg total) by mouth 2 (two) times daily as needed. For outbreaks 30 tablet 2   No current facility-administered medications on file prior to visit.     Review of Systems:  As per HPI- otherwise negative. No fever or chills  Physical Examination: There were no vitals filed for this visit. There  were no vitals filed for this visit. There is no height or weight on file to calculate BMI. Ideal Body Weight:    She sounds well- no cough, tachypnea, wheezing noted over phone   Assessment and Plan: Dysuria - Plan: nitrofurantoin, macrocrystal-monohydrate, (MACROBID) 100 MG capsule  Sheryl Bryan has noticed symptoms typical of a UTI.  Given pandemic, would prefer not have her come into the office.  I have called in a prescription for nitrofurantoin, she will drink plenty of fluids and let me know if not feeling better in 1 to 2 days.  Sooner if worse  The following my chart message sent to patient as a visit summary: It was great to talk with you today,I  hope that you are feeling much better soon! We will treat you with Macrobid antibiotic for presumed UTI Drinking plenty of fluids to flush your bladder can also be helpful Please do let me know if you are not feeling better in the next couple of days, sooner if worse  Signed Lamar Blinks, MD

## 2018-11-30 ENCOUNTER — Encounter: Payer: Self-pay | Admitting: Family Medicine

## 2018-12-03 ENCOUNTER — Other Ambulatory Visit: Payer: Self-pay

## 2018-12-03 ENCOUNTER — Ambulatory Visit (INDEPENDENT_AMBULATORY_CARE_PROVIDER_SITE_OTHER): Payer: BLUE CROSS/BLUE SHIELD | Admitting: Family

## 2018-12-03 DIAGNOSIS — R0982 Postnasal drip: Secondary | ICD-10-CM

## 2018-12-03 DIAGNOSIS — J029 Acute pharyngitis, unspecified: Secondary | ICD-10-CM | POA: Diagnosis not present

## 2018-12-03 MED ORDER — LORATADINE 10 MG PO TABS: 10.0000 mg | ORAL_TABLET | Freq: Every day | ORAL | 11 refills | Status: AC

## 2018-12-03 MED ORDER — OMEPRAZOLE 40 MG PO CPDR: 40.0000 mg | DELAYED_RELEASE_CAPSULE | Freq: Every day | ORAL | 2 refills | Status: DC

## 2018-12-03 NOTE — Progress Notes (Signed)
Virtual Visit via Video Note  I connected with Bard Herbert on 12/03/18 at 11:20 AM EDT by a video enabled telemedicine application and verified that I am speaking with the correct person using two identifiers. This visit type was conducted due to national recommendations for restrictions regarding the COVID-19 Pandemic (e.g. social distancing).  This format is felt to be most appropriate for this patient at this time.   I discussed the limitations of evaluation and management by telemedicine and the availability of in person appointments. The patient expressed understanding and agreed to proceed.  Only the patient and myself were on today's video visit. The patient was at home and I was in my office at the time of today's visit.   History of Present Illness:  Patient is a 58 yr old female who presents today to discuss chief complaint of sore throat. Reports that she was given nasal drops and singulair in early April for same symptoms. Reports that symptoms have been present since that time. No significant improvement with these meds but notes she still has some post nasal drainage. Reports pain is like a burning. Reports that her throat feels a little swollen.  Denies associated fever. Reports that it is not hard to swallow but "it is a weird feeling." Reports some associated voice hoarseness. Reports that she did try some tylenol and motrin which have helped briefly.    She reports that she has looked at her tonsils and notes no "white spots." Does feel like "there is a little swelling back there."  Observations/Objective:   Gen: Awake, alert, no acute distress ENT: mild voice hoarseness Resp: Breathing is even and non-labored Psych: calm/pleasant demeanor Neuro: Alert and Oriented x 3, + facial symmetry, speech is clear.  Assessment and Plan:  Sore throat- Clinically low suspicion for strep throat. I suspect sxs are secondary to post-nasal drip +/- GERD.  Continue atrovent nasal spray  and singulair. Give a trial of claritin and rx strength omeprazole. Pt is advised to call if symptoms worsen or if symptoms fail to improve in the next 1 week. Also she is advised to call if she develops fever.   Follow Up Instructions:    I discussed the assessment and treatment plan with the patient. The patient was provided an opportunity to ask questions and all were answered. The patient agreed with the plan and demonstrated an understanding of the instructions.   The patient was advised to call back or seek an in-person evaluation if the symptoms worsen or if the condition fails to improve as anticipated.    Nance Pear, NP

## 2018-12-08 ENCOUNTER — Telehealth: Payer: Self-pay

## 2018-12-08 ENCOUNTER — Encounter: Payer: Self-pay | Admitting: Family Medicine

## 2018-12-08 DIAGNOSIS — J029 Acute pharyngitis, unspecified: Secondary | ICD-10-CM

## 2018-12-08 MED ORDER — PENICILLIN V POTASSIUM 500 MG PO TABS: 500.0000 mg | ORAL_TABLET | Freq: Two times a day (BID) | ORAL | 0 refills | Status: DC

## 2018-12-08 NOTE — Telephone Encounter (Signed)
Can not bring patient in to the office with sore throat although she is requesting.

## 2018-12-08 NOTE — Telephone Encounter (Signed)
Copied from [place] [phone]. Topic: Appointment Scheduling - Scheduling Inquiry for Clinic >> Dec 07, 2018  3:57 PM Rutherford Nail, [place] wrote: Reason for CRM: Patient calling and is requesting and in office visit. States that she has been seen multiple times on virtual visit for her sore throat. States that the medications that have been prescribed have not been working and she thinks this needs to be addressed in person. Please advise.

## 2018-12-14 DIAGNOSIS — J029 Acute pharyngitis, unspecified: Secondary | ICD-10-CM | POA: Diagnosis not present

## 2018-12-21 ENCOUNTER — Encounter: Payer: Self-pay | Admitting: Family Medicine

## 2018-12-21 DIAGNOSIS — H938X9 Other specified disorders of ear, unspecified ear: Secondary | ICD-10-CM

## 2018-12-21 NOTE — Addendum Note (Signed)
Addended by: Lamar Blinks C on: 12/21/2018 01:40 PM   Modules accepted: Orders

## 2018-12-28 DIAGNOSIS — K219 Gastro-esophageal reflux disease without esophagitis: Secondary | ICD-10-CM | POA: Diagnosis not present

## 2018-12-28 DIAGNOSIS — H6983 Other specified disorders of Eustachian tube, bilateral: Secondary | ICD-10-CM | POA: Diagnosis not present

## 2018-12-28 DIAGNOSIS — Z7289 Other problems related to lifestyle: Secondary | ICD-10-CM | POA: Diagnosis not present

## 2019-01-13 ENCOUNTER — Other Ambulatory Visit: Payer: Self-pay | Admitting: Family Medicine

## 2019-01-13 ENCOUNTER — Encounter: Payer: Self-pay | Admitting: Family Medicine

## 2019-01-13 DIAGNOSIS — B009 Herpesviral infection, unspecified: Secondary | ICD-10-CM

## 2019-01-13 MED ORDER — FLUCONAZOLE 150 MG PO TABS: 150.0000 mg | ORAL_TABLET | Freq: Once | ORAL | 0 refills | Status: AC

## 2019-01-13 MED ORDER — VALACYCLOVIR HCL 500 MG PO TABS: 500.0000 mg | ORAL_TABLET | Freq: Two times a day (BID) | ORAL | 2 refills | Status: DC | PRN

## 2019-02-10 ENCOUNTER — Telehealth (HOSPITAL_COMMUNITY): Payer: Self-pay

## 2019-02-10 NOTE — Telephone Encounter (Signed)
Called to schedule f/u cta head/neck. Pt does not want to schedule at this time. She will give [place] a call when she is ready to schedule. AW

## 2019-02-16 ENCOUNTER — Telehealth: Payer: Self-pay

## 2019-02-16 NOTE — Telephone Encounter (Signed)
"   I called pt and she states she has been having chest/throat congestion for months. She did go to the ENT as referred but is still having issues. She has no cough, sore throat, fever, etc. She states it is uncomfortable. Pt requesting in office visit if possible. Pt lives close by. Please advise." -Sheryl Bryan fron staff  Ok to schedule patient in office?

## 2019-02-16 NOTE — Telephone Encounter (Signed)
Please call and make sure stable/ non acute.  If so I will see her in the office next week

## 2019-02-17 NOTE — Telephone Encounter (Signed)
I just received message yesterday but apparently patient had called last week. Patient has scheduled a visit at the ENT on Tuesday. Advised to call us back we will be happy to see her if need be.

## 2019-02-28 DIAGNOSIS — R0989 Other specified symptoms and signs involving the circulatory and respiratory systems: Secondary | ICD-10-CM | POA: Diagnosis not present

## 2019-02-28 DIAGNOSIS — K219 Gastro-esophageal reflux disease without esophagitis: Secondary | ICD-10-CM | POA: Diagnosis not present

## 2019-03-07 ENCOUNTER — Encounter: Payer: Self-pay | Admitting: Family Medicine

## 2019-03-07 DIAGNOSIS — K219 Gastro-esophageal reflux disease without esophagitis: Secondary | ICD-10-CM

## 2019-03-07 NOTE — Addendum Note (Signed)
Addended by: Lamar Blinks C on: 03/07/2019 07:56 PM   Modules accepted: Orders

## 2019-03-08 ENCOUNTER — Encounter: Payer: Self-pay | Admitting: Gastroenterology

## 2019-03-19 NOTE — Progress Notes (Signed)
Cardiology Office Note   Date:  03/21/2019   ID:  Sheryl LUCKEN, DOB July 25, 1961, MRN [id number]  PCP:  Sheryl Mclean, MD  Cardiologist: Dr. Harrell Gave No chief complaint on file.    History of Present Illness: Sheryl Bryan is a 58 y.o. female who presents for ongoing assessment and management of chest pain.  The patient was initially seen in the office in October 2019 by Dr. Harrell Gave to be established due to symptoms.  Patient has a history of breast ductal hyperplasia, and GERD.  At the time of the office visit Dr. Harrell Gave  felt that the pain was atypical however, as his pain is new for her, she was scheduled for an exercise stress test.  This was completed on 05/12/2018 which revealed good exercise capacity, no EKG changes to suggest ischemia.  She was found to have a hypertensive response to exercise and is advised to follow with PCP for management.  She was continued on primary prevention for CAD to include diet, exercise, and maintenance of weight.  She has been having some tightening in her throat without dysphagia. She has been seen by ENT and not found to have abnormalities. She is scheduled to see GI this week. She denies chest pain. She walks 2 miles a day and does Zumba twice a week.   Past Medical History:  Diagnosis Date   GERD (gastroesophageal reflux disease)    occ   Papilloma of breast    UTI (urinary tract infection)     Past Surgical History:  Procedure Laterality Date   BREAST LUMPECTOMY WITH RADIOACTIVE SEED LOCALIZATION Right 09/11/2016   Procedure: RIGHT BREAST LUMPECTOMY WITH RADIOACTIVE SEED LOCALIZATION;  Surgeon: Erroll Luna, MD;  Location: [facility] OR;  Service: General;  Laterality: Right;   CESAREAN SECTION  12/04/93, 01/05/79   ENDOMETRIAL ABLATION  2008   FRACTURE SURGERY     rt pinkie toe   PARTIAL MASTECTOMY WITH NEEDLE LOCALIZATION  09/01/2012   Procedure: PARTIAL MASTECTOMY WITH NEEDLE LOCALIZATION;  Surgeon: Marcello Moores A. Cornett,  MD;  Location: [place] OR;  Service: General;  Laterality: Right;     Current Outpatient Medications  Medication Sig Dispense Refill   Cholecalciferol (VITAMIN D-3) 1000 units CAPS Take 1 capsule by mouth daily.     ibuprofen (ADVIL,MOTRIN) 200 MG tablet Take 600 mg by mouth every 6 (six) hours as needed for headache or moderate pain.     ipratropium (ATROVENT) 0.03 % nasal spray Place 2 sprays into the nose 4 (four) times daily. Use as needed for post nasal drip 30 mL 6   loratadine (CLARITIN) 10 MG tablet Take 1 tablet (10 mg total) by mouth daily. 30 tablet 11   meloxicam (MOBIC) 7.5 MG tablet Take 1 tablet (7.5 mg total) by mouth daily. Use as needed for joint pain 30 tablet 6   montelukast (SINGULAIR) 10 MG tablet Take 1 tablet (10 mg total) by mouth at bedtime. Use as needed for allergies 30 tablet 3   omeprazole (PRILOSEC) 40 MG capsule Take 1 capsule (40 mg total) by mouth daily. 30 capsule 2   valACYclovir (VALTREX) 500 MG tablet TAKE 1 TABLET BY MOUTH TWICE DAILY AS NEEDED FOR OUTBREAKS. 30 tablet 2   valACYclovir (VALTREX) 500 MG tablet Take 1 tablet (500 mg total) by mouth 2 (two) times daily as needed. For outbreaks 30 tablet 2   No current facility-administered medications for this visit.     Allergies:   Ciprofloxacin and Sulfa antibiotics  Social History:  The patient  reports that she has never smoked. She has never used smokeless tobacco. She reports current alcohol use. She reports that she does not use drugs.   Family History:  The patient's family history includes COPD in her maternal aunt; Cancer in her brother, maternal aunt, maternal aunt, and mother; Hypertension in her mother.    ROS: All other systems are reviewed and negative. Unless otherwise mentioned in H&P    PHYSICAL EXAM: VS:  BP 122/84    Pulse 71    Ht 5\' 7"  ([id number] m)    Wt 165 lb 12.8 oz (75.2 kg)    BMI 25.97 kg/m  , BMI Body mass index is 25.97 kg/m. GEN: Well nourished, well developed,  in no acute distress HEENT: normal Neck: no JVD, carotid bruits, or masses Cardiac: RRR; no murmurs, rubs, or gallops,no edema  Respiratory:  Clear to auscultation bilaterally, normal work of breathing GI: soft, nontender, nondistended, + BS MS: no deformity or atrophy Skin: warm and dry, no rash Neuro:  Strength and sensation are intact Psych: euthymic mood, full affect   EKG:  NSR with possible left atrial enlargement.   Recent Labs: 07/06/2018: ALT 14; BUN 10; Creatinine, Ser 0.76; Hemoglobin 12.6; Platelets 203; Potassium 3.4; Sodium 142    Lipid Panel    Component Value Date/Time   CHOL 184 06/03/2018 0946   TRIG 73.0 06/03/2018 0946   HDL 59.50 06/03/2018 0946   CHOLHDL 3 06/03/2018 0946   VLDL 14.6 06/03/2018 0946   LDLCALC 110 (H) 06/03/2018 0946      Wt Readings from Last 3 Encounters:  03/21/19 165 lb 12.8 oz (75.2 kg)  08/04/18 165 lb (74.8 kg)  07/07/18 160 lb 15 oz (73 kg)      Other studies Reviewed: GXT 05/12/2918.   Blood pressure demonstrated a hypertensive response to exercise.  There was no ST segment deviation noted during stress.  Good exercise tolerance achieving 10.1 mets and 89% MPHR.  No EKG criteria of ischemia noted.  This is a low risk study.     ASSESSMENT AND PLAN:  1. Throat pain: Does not appear to be cardiac in etiology. Agree with follow up with GI for further recommendations.  No planned ischemic testing as she is active, without chest pain, or DOE. Normal stress test one year ago.   Will see her PRN.    Current medicines are reviewed at length with the patient today.    Labs/ tests ordered today include: None  Phill Myron. West Pugh, ANP, AACC   03/21/2019 10:50 AM    [address] Office [phone] Fax [phone]

## 2019-03-21 ENCOUNTER — Ambulatory Visit: Payer: BLUE CROSS/BLUE SHIELD | Admitting: Adult Health

## 2019-03-21 ENCOUNTER — Encounter: Payer: Self-pay | Admitting: Adult Health

## 2019-03-21 ENCOUNTER — Other Ambulatory Visit: Payer: Self-pay

## 2019-03-21 VITALS — BP 122/84 | HR 71 | Ht 67.0 in | Wt 165.8 lb

## 2019-03-21 DIAGNOSIS — R079 Chest pain, unspecified: Secondary | ICD-10-CM

## 2019-03-21 DIAGNOSIS — R0789 Other chest pain: Secondary | ICD-10-CM

## 2019-03-21 DIAGNOSIS — R070 Pain in throat: Secondary | ICD-10-CM

## 2019-03-21 NOTE — Patient Instructions (Signed)

## 2019-03-24 ENCOUNTER — Other Ambulatory Visit: Payer: Self-pay

## 2019-03-24 ENCOUNTER — Encounter: Payer: Self-pay | Admitting: Gastroenterology

## 2019-03-24 ENCOUNTER — Ambulatory Visit: Payer: BC Managed Care – PPO | Admitting: Gastroenterology

## 2019-03-24 VITALS — BP 116/76 | HR 104 | Temp 98.4°F | Ht 67.0 in | Wt 165.2 lb

## 2019-03-24 DIAGNOSIS — R0989 Other specified symptoms and signs involving the circulatory and respiratory systems: Secondary | ICD-10-CM

## 2019-03-24 DIAGNOSIS — K219 Gastro-esophageal reflux disease without esophagitis: Secondary | ICD-10-CM

## 2019-03-24 DIAGNOSIS — R12 Heartburn: Secondary | ICD-10-CM

## 2019-03-24 DIAGNOSIS — R198 Other specified symptoms and signs involving the digestive system and abdomen: Secondary | ICD-10-CM

## 2019-03-24 MED ORDER — PANTOPRAZOLE SODIUM 40 MG PO TBEC: 40.0000 mg | DELAYED_RELEASE_TABLET | Freq: Two times a day (BID) | ORAL | 0 refills | Status: DC

## 2019-03-24 MED ORDER — PANTOPRAZOLE SODIUM 40 MG PO TBEC: 40.0000 mg | DELAYED_RELEASE_TABLET | Freq: Every day | ORAL | 3 refills | Status: DC

## 2019-03-24 MED ORDER — PANTOPRAZOLE SODIUM 40 MG PO TBEC: 40.0000 mg | DELAYED_RELEASE_TABLET | Freq: Every day | ORAL | 3 refills | Status: AC

## 2019-03-24 NOTE — Patient Instructions (Signed)
If you are age 56 or older, your body mass index should be between 23-30. Your Body mass index is 25.88 kg/m. If this is out of the aforementioned range listed, please consider follow up with your Primary Care Provider.  If you are age 78 or younger, your body mass index should be between 19-25. Your Body mass index is 25.88 kg/m. If this is out of the aformentioned range listed, please consider follow up with your Primary Care Provider.   To help prevent the possible spread of infection to our patients, communities, and staff; we will be implementing the following measures:  As of now we are not allowing any visitors/family members to accompany you to any upcoming appointments with [facility]. If you have any concerns about this please contact our office to discuss prior to the appointment.   We have sent the following medications to your pharmacy for you to pick up at your convenience: Protonix 40mg  twice daily for 4 weeks then decrease to once daily.  Please call our office at [phone] to set up your 3 month follow up visit.  It was a pleasure to see you today!  Vito Cirigliano, D.O.

## 2019-03-24 NOTE — Progress Notes (Signed)
Chief Complaint: Heartburn, globus sensation  Referring Provider:     Darreld Mclean, MD  HPI:    Sheryl Bryan is a 58 y.o. female referred to the Gastroenterology Clinic for evaluation of heartburn and dysphagia and globus sensation. She has had sxs for the last 5 months or so, starting as throat tightening and globus sensation. Initially trialed allergy meds with no change. Also reports HB. No dysphagia or odynophagia. HB worse at night and exacerbated by spicy food and tomato based foods.  Was evaluated by ENT at [facility] in 12/2018, started on omeprazole 40 mg at night.  Was also taking Pepcid OTC every morning, but ongoing nocturnal and daytime symptoms.  Followed up with ENT early this month, and increased Prilosec to 40 mg bid with recommendation to refer to GI. Has not had bid dosing approved by pharmacy yet, so hasnt yet trialed as BID.   Was seen by Dr. Lorelei Pont on 8/24, with ongoing reflux symptoms.  Evaluated by Dr. Harrell Gave (Cardiology) with good exercise stress test in 04/2018, no EKG changes.  Otherwise, leads an active lifestyle with regular exercise. Had f/u with Cardiology earlier this week.   Endoscopic history: -Colonoscopy (2015): Normal. Repeat in 10 years.    Past Medical History:  Diagnosis Date  . GERD (gastroesophageal reflux disease)    occ  . Papilloma of breast   . UTI (urinary tract infection)      Past Surgical History:  Procedure Laterality Date  . BREAST LUMPECTOMY WITH RADIOACTIVE SEED LOCALIZATION Right 09/11/2016   Procedure: RIGHT BREAST LUMPECTOMY WITH RADIOACTIVE SEED LOCALIZATION;  Surgeon: Erroll Luna, MD;  Location: [place];  Service: General;  Laterality: Right;  . CESAREAN SECTION  12/04/93, 01/05/79  . COLONOSCOPY  2015   [facility]  . ENDOMETRIAL ABLATION  2008  . FRACTURE SURGERY     rt pinkie toe  . PARTIAL MASTECTOMY WITH NEEDLE LOCALIZATION  09/01/2012   Procedure: PARTIAL MASTECTOMY WITH NEEDLE  LOCALIZATION;  Surgeon: Marcello Moores A. Cornett, MD;  Location: [place] OR;  Service: General;  Laterality: Right;   Family History  Problem Relation Age of Onset  . Cancer Mother        multiple myeloma  . Hypertension Mother   . Cancer Brother        leukemia  . Cancer Maternal Aunt        breast  . Cancer Maternal Aunt   . COPD Maternal Aunt        breast - great aunt  . Colon cancer Neg Hx   . Esophageal cancer Neg Hx    Social History   Tobacco Use  . Smoking status: Never Smoker  . Smokeless tobacco: Never Used  Substance Use Topics  . Alcohol use: Yes    Comment: occasional  . Drug use: No   Current Outpatient Medications  Medication Sig Dispense Refill  . Cholecalciferol (VITAMIN D-3) 1000 units CAPS Take 1 capsule by mouth daily.    . fluticasone (FLONASE) 50 MCG/ACT nasal spray Place 2 sprays into both nostrils at bedtime.    Marland Kitchen ibuprofen (ADVIL,MOTRIN) 200 MG tablet Take 600 mg by mouth every 6 (six) hours as needed for headache or moderate pain.    Marland Kitchen ipratropium (ATROVENT) 0.03 % nasal spray Place 2 sprays into the nose 4 (four) times daily. Use as needed for post nasal drip 30 mL 6  . loratadine (CLARITIN) 10 MG tablet  Take 1 tablet (10 mg total) by mouth daily. 30 tablet 11  . meloxicam (MOBIC) 7.5 MG tablet Take 1 tablet (7.5 mg total) by mouth daily. Use as needed for joint pain 30 tablet 6  . montelukast (SINGULAIR) 10 MG tablet Take 1 tablet (10 mg total) by mouth at bedtime. Use as needed for allergies 30 tablet 3  . omeprazole (PRILOSEC) 40 MG capsule Take 1 capsule (40 mg total) by mouth daily. 30 capsule 2  . valACYclovir (VALTREX) 500 MG tablet Take 1 tablet (500 mg total) by mouth 2 (two) times daily as needed. For outbreaks 30 tablet 2   No current facility-administered medications for this visit.    Allergies  Allergen Reactions  . Ciprofloxacin Nausea And Vomiting  . Sulfa Antibiotics Hives    Spreads all over the body     Review of Systems: All  systems reviewed and negative except where noted in HPI.     Physical Exam:    Wt Readings from Last 3 Encounters:  03/24/19 165 lb 4 oz (75 kg)  03/21/19 165 lb 12.8 oz (75.2 kg)  08/04/18 165 lb (74.8 kg)    BP 116/76   Pulse (!) 104   Temp 98.4 F (36.9 C)   Ht 5' 7"  (1.702 m)   Wt 165 lb 4 oz (75 kg)   BMI 25.88 kg/m  Constitutional:  Pleasant, in no acute distress. Psychiatric: Normal mood and affect. Behavior is normal. EENT: Pupils normal.  Conjunctivae are normal. No scleral icterus. Neck supple. No cervical LAD. Cardiovascular: Normal rate, regular rhythm. No edema Pulmonary/chest: Effort normal and breath sounds normal. No wheezing, rales or rhonchi. Abdominal: Soft, nondistended, nontender. Bowel sounds active throughout. There are no masses palpable. No hepatomegaly. Neurological: Alert and oriented to person place and time. Skin: Skin is warm and dry. No rashes noted.   ASSESSMENT AND PLAN;   1) GERD 2) Heartburn 3) Globus sensation  - Protonix 40 mg PO BID x4 weeks for diagnostic and therapeutic intent, then if well controlled, decrease to 40 mg pre-dinner and eventually will continue to titrate to lowest effectove dose - If incomplete response or ongoing concerns, will plan for EGD to eval for EE, HH, LES laxity, etc - Continue antireflux lifestyle/dietary mods  RTC in 3-6 months or sooner as needed   Sheryl Bullion, DO, FACG  03/24/2019, 9:54 AM   [place], Gay Filler, MD

## 2019-04-06 ENCOUNTER — Other Ambulatory Visit: Payer: Self-pay

## 2019-04-07 ENCOUNTER — Encounter: Payer: Self-pay | Admitting: Family Medicine

## 2019-04-07 ENCOUNTER — Ambulatory Visit: Payer: BC Managed Care – PPO | Admitting: Family Medicine

## 2019-04-07 VITALS — BP 126/80 | HR 90 | Temp 96.7°F | Resp 16 | Ht 67.0 in | Wt 165.0 lb

## 2019-04-07 DIAGNOSIS — Z23 Encounter for immunization: Secondary | ICD-10-CM

## 2019-04-07 DIAGNOSIS — Z13220 Encounter for screening for lipoid disorders: Secondary | ICD-10-CM | POA: Diagnosis not present

## 2019-04-07 DIAGNOSIS — Z209 Contact with and (suspected) exposure to unspecified communicable disease: Secondary | ICD-10-CM

## 2019-04-07 DIAGNOSIS — R0989 Other specified symptoms and signs involving the circulatory and respiratory systems: Secondary | ICD-10-CM

## 2019-04-07 DIAGNOSIS — R7303 Prediabetes: Secondary | ICD-10-CM | POA: Diagnosis not present

## 2019-04-07 DIAGNOSIS — Z130 Encounter for screening for diseases of the blood and blood-forming organs and certain disorders involving the immune mechanism: Secondary | ICD-10-CM

## 2019-04-07 DIAGNOSIS — R0982 Postnasal drip: Secondary | ICD-10-CM | POA: Diagnosis not present

## 2019-04-07 DIAGNOSIS — Z20820 Contact with and (suspected) exposure to varicella: Secondary | ICD-10-CM

## 2019-04-07 DIAGNOSIS — R198 Other specified symptoms and signs involving the digestive system and abdomen: Secondary | ICD-10-CM

## 2019-04-07 LAB — COMPREHENSIVE METABOLIC PANEL
ALT: 10 U/L (ref 0–35)
AST: 16 U/L (ref 0–37)
Albumin: 4.2 g/dL (ref 3.5–5.2)
Alkaline Phosphatase: 54 U/L (ref 39–117)
BUN: 11 mg/dL (ref 6–23)
CO2: 31 mEq/L (ref 19–32)
Calcium: 9.2 mg/dL (ref 8.4–10.5)
Chloride: 107 mEq/L (ref 96–112)
Creatinine, Ser: 0.86 mg/dL (ref 0.40–1.20)
GFR: 82.61 mL/min (ref >60.00)
Glucose, Bld: 87 mg/dL (ref 70–99)
Potassium: 4.1 mEq/L (ref 3.5–5.1)
Sodium: 144 mEq/L (ref 135–145)
Total Bilirubin: 0.5 mg/dL (ref 0.2–1.2)
Total Protein: 6.3 g/dL (ref 6.0–8.3)

## 2019-04-07 LAB — LIPID PANEL
Cholesterol: 187 mg/dL (ref 0–200)
HDL: 56 mg/dL (ref >39.00)
LDL Cholesterol: 122 mg/dL — ABNORMAL HIGH (ref 0–99)
NonHDL: 131.11
Total CHOL/HDL Ratio: 3
Triglycerides: 44 mg/dL (ref 0.0–149.0)
VLDL: 8.8 mg/dL (ref 0.0–40.0)

## 2019-04-07 LAB — CBC
HCT: 38.8 % (ref 36.0–46.0)
Hemoglobin: 12.3 g/dL (ref 12.0–15.0)
MCHC: 31.7 g/dL (ref 30.0–36.0)
MCV: 82.8 fl (ref 78.0–100.0)
Platelets: 205 10*3/uL (ref 150.0–400.0)
RBC: 4.68 Mil/uL (ref 3.87–5.11)
RDW: 14.6 % (ref 11.5–15.5)
WBC: 3.8 10*3/uL — ABNORMAL LOW (ref 4.0–10.5)

## 2019-04-07 LAB — HEMOGLOBIN A1C: Hgb A1c MFr Bld: 6 % (ref 4.6–6.5)

## 2019-04-07 MED ORDER — IPRATROPIUM BROMIDE 0.03 % NA SOLN: 2.0000 | Freq: Three times a day (TID) | NASAL | 6 refills | Status: AC

## 2019-04-07 NOTE — Patient Instructions (Addendum)
It was great to see you again today- I will be in touch with your labs Flu shot today We will see if you are immune to chicken pox  Try the atrovent nasal three times a day for about 2 weeks.  If you still have the issue with your throat after that, I would encourage you to see Dr Bryan Lemma about the upper GI scope

## 2019-04-07 NOTE — Progress Notes (Addendum)
[facility] [address], [place], [place] [address] [phone]  Date:  04/07/2019   Name:  Sheryl Bryan   DOB:  1961/07/06   MRN:  [id number]  PCP:  Darreld Mclean, MD    Chief Complaint: Post Nasal Drip (worse at night, feels like a lump of mucous. on going, advised to follow up with pcp, seen gi and ent)   History of Present Illness:  Sheryl Bryan is a 58 y.o. very pleasant female patient who presents with the following:  Patient with history of prediabetes, papilloma of breast, vitamin D deficiency.  Here today with concern of postnasal drainage She has been to see ENT and also GI for concern of globus sensation, dysphasia and heartburn ENT started her on omeprazole She saw GI about 3 weeks ago, they increased dose of Protonix to 40 twice daily for 4 weeks, if symptoms continue plan for EGD She also saw cardiology for similar symptoms last year-she did a treadmill test and did fine  At this time she feels like there is a "clump of phlegm" in her throat which she is constantly trying to get clear.   She is not coughing up anything, but will often clear her throat She is using flonase before bed and her protonix  Her ENT did not have any other suggestions really  She does use atrovent nasal as well on occasion  Flu shot- do today  Mentioned shingrix to her.  She does not recall having had chickenpox.  We will check a titer for her Pap- per her GYN  Most recent mammogram done by her GYN in January  She is working from home 3 days of the week, and going to the office 2 other days.  She and her husband have been in good health during the pandemic Patient Active Problem List   Diagnosis Date Noted  . Dizziness 07/06/2018  . Low back pain 08/05/2017  . Pre-diabetes 05/21/2017  . Vitamin D deficiency 05/21/2017  . Insomnia 03/27/2016  . Atypical ductal hyperplasia of right breast 08/22/2015    Past  Medical History:  Diagnosis Date  . GERD (gastroesophageal reflux disease)    occ  . Papilloma of breast   . UTI (urinary tract infection)     Past Surgical History:  Procedure Laterality Date  . BREAST LUMPECTOMY WITH RADIOACTIVE SEED LOCALIZATION Right 09/11/2016   Procedure: RIGHT BREAST LUMPECTOMY WITH RADIOACTIVE SEED LOCALIZATION;  Surgeon: Erroll Luna, MD;  Location: [facility];  Service: General;  Laterality: Right;  . CESAREAN SECTION  12/04/93, 01/05/79  . COLONOSCOPY  2015   [facility]  . ENDOMETRIAL ABLATION  2008  . FRACTURE SURGERY     rt pinkie toe  . PARTIAL MASTECTOMY WITH NEEDLE LOCALIZATION  09/01/2012   Procedure: PARTIAL MASTECTOMY WITH NEEDLE LOCALIZATION;  Surgeon: Marcello Moores A. Cornett, MD;  Location: [facility];  Service: General;  Laterality: Right;    Social History   Tobacco Use  . Smoking status: Never Smoker  . Smokeless tobacco: Never Used  Substance Use Topics  . Alcohol use: Yes    Comment: occasional  . Drug use: No    Family History  Problem Relation Age of Onset  . Cancer Mother        multiple myeloma  . Hypertension Mother   . Cancer Brother        leukemia  . Cancer Maternal Aunt  breast  . Cancer Maternal Aunt   . COPD Maternal Aunt        breast - great aunt  . Colon cancer Neg Hx   . Esophageal cancer Neg Hx     Allergies  Allergen Reactions  . Ciprofloxacin Nausea And Vomiting  . Sulfa Antibiotics Hives    Spreads all over the body    Medication list has been reviewed and updated.  Current Outpatient Medications on File Prior to Visit  Medication Sig Dispense Refill  . Cholecalciferol (VITAMIN D-3) 1000 units CAPS Take 1 capsule by mouth daily.    . fluticasone (FLONASE) 50 MCG/ACT nasal spray Place 2 sprays into both nostrils at bedtime.    Marland Kitchen ibuprofen (ADVIL,MOTRIN) 200 MG tablet Take 600 mg by mouth every 6 (six) hours as needed for headache or moderate pain.    Marland Kitchen loratadine (CLARITIN) 10 MG tablet Take 1 tablet  (10 mg total) by mouth daily. 30 tablet 11  . meloxicam (MOBIC) 7.5 MG tablet Take 1 tablet (7.5 mg total) by mouth daily. Use as needed for joint pain 30 tablet 6  . montelukast (SINGULAIR) 10 MG tablet Take 1 tablet (10 mg total) by mouth at bedtime. Use as needed for allergies 30 tablet 3  . omeprazole (PRILOSEC) 40 MG capsule Take 1 capsule (40 mg total) by mouth daily. 30 capsule 2  . [START ON 04/21/2019] pantoprazole (PROTONIX) 40 MG tablet Take 1 tablet (40 mg total) by mouth daily. 90 tablet 3  . pantoprazole (PROTONIX) 40 MG tablet Take 1 tablet (40 mg total) by mouth 2 (two) times daily for 28 days. 56 tablet 0  . valACYclovir (VALTREX) 500 MG tablet Take 1 tablet (500 mg total) by mouth 2 (two) times daily as needed. For outbreaks 30 tablet 2   No current facility-administered medications on file prior to visit.     Review of Systems:  As per HPI- otherwise negative. No fever or chills, no chest pain or shortness of breath  BP Readings from Last 3 Encounters:  04/07/19 126/80  03/24/19 116/76  03/21/19 122/84   Wt Readings from Last 3 Encounters:  04/07/19 165 lb (74.8 kg)  03/24/19 165 lb 4 oz (75 kg)  03/21/19 165 lb 12.8 oz (75.2 kg)    Physical Examination: Vitals:   04/07/19 1020 04/07/19 1044  BP: 126/80   Pulse: 100 90  Resp: 16   Temp: (!) 96.7 F (35.9 C)   SpO2: 98%    Vitals:   04/07/19 1020  Weight: 165 lb (74.8 kg)  Height: _0  (1.702 m)   Body mass index is 25.84 kg/m. Ideal Body Weight: Weight in (lb) to have BMI = 25: 159.3  GEN: WDWN, NAD, Non-toxic, A & O x 3, peers healthy and younger than age 71: Atraumatic, Normocephalic. Neck supple. No masses, No LAD.  Bilateral TM wnl, oropharynx normal.  PEERL,EOMI.   Ears and Nose: No external deformity. CV: RRR, No M/G/R. No JVD. No thrill. No extra heart sounds. PULM: CTA B, no wheezes, crackles, rhonchi. No retractions. No resp. distress. No accessory muscle use. ABD: S, NT, ND, +BS. No  rebound. No HSM. EXTR: No c/c/e NEURO Normal gait.  PSYCH: Normally interactive. Conversant. Not depressed or anxious appearing.  Calm demeanor.   Wt Readings from Last 3 Encounters:  04/07/19 165 lb (74.8 kg)  03/24/19 165 lb 4 oz (75 kg)  03/21/19 165 lb 12.8 oz (75.2 kg)    Assessment and Plan: Globus sensation  PND (post-nasal drip) - Plan: ipratropium (ATROVENT) 0.03 % nasal spray  Pre-diabetes - Plan: Comprehensive metabolic panel, Hemoglobin A1c  Screening for deficiency anemia - Plan: CBC  Screening for hyperlipidemia - Plan: Lipid panel  Immunization due  Exposure to communicable disease - Plan: Varicella zoster antibody, IgG  Here today with concern of persistent globus sensation and feels that there is phlegm in her throat.  She has been to see ear nose and throat and also gastroenterology.  She is currently doing a course of high-dose PPI, if this fails to resolve her symptoms the plan is for upper GI scope.  I also suggested that she try Atrovent nasal spray 3 times daily for about 3 weeks She will let me know how this works for her Labs pending as above, screen for immunity to chickenpox  Signed Lamar Blinks, MD  Received her labs, message to patient Results for orders placed or performed in visit on 04/07/19  CBC  Result Value Ref Range   WBC 3.8 (L) 4.0 - 10.5 K/uL   RBC 4.68 3.87 - 5.11 Mil/uL   Platelets 205.0 150.0 - 400.0 K/uL   Hemoglobin 12.3 12.0 - 15.0 g/dL   HCT 38.8 36.0 - 46.0 %   MCV 82.8 78.0 - 100.0 fl   MCHC 31.7 30.0 - 36.0 g/dL   RDW 14.6 11.5 - 15.5 %  Comprehensive metabolic panel  Result Value Ref Range   Sodium 144 135 - 145 mEq/L   Potassium 4.1 3.5 - 5.1 mEq/L   Chloride 107 96 - 112 mEq/L   CO2 31 19 - 32 mEq/L   Glucose, Bld 87 70 - 99 mg/dL   BUN 11 6 - 23 mg/dL   Creatinine, Ser 0.86 0.40 - 1.20 mg/dL   Total Bilirubin 0.5 0.2 - 1.2 mg/dL   Alkaline Phosphatase 54 39 - 117 U/L   AST 16 0 - 37 U/L   ALT 10 0 - 35  U/L   Total Protein 6.3 6.0 - 8.3 g/dL   Albumin 4.2 3.5 - 5.2 g/dL   Calcium 9.2 8.4 - 10.5 mg/dL   GFR 82.61 >60.00 mL/min  Hemoglobin A1c  Result Value Ref Range   Hgb A1c MFr Bld 6.0 4.6 - 6.5 %  Lipid panel  Result Value Ref Range   Cholesterol 187 0 - 200 mg/dL   Triglycerides 44.0 0.0 - 149.0 mg/dL   HDL 56.00 >39.00 mg/dL   VLDL 8.8 0.0 - 40.0 mg/dL   LDL Cholesterol 122 (H) 0 - 99 mg/dL   Total CHOL/HDL Ratio 3    NonHDL 131.11    The 10-year ASCVD risk score Mikey Bussing DC Jr., et al., 2013) is: 2.8%   Values used to calculate the score:     Age: 73 years     Sex: Female     Is Non-Hispanic African American: Yes     Diabetic: No     Tobacco smoker: No     Systolic Blood Pressure: 144 mmHg     Is BP treated: No     HDL Cholesterol: 56 mg/dL     Total Cholesterol: 187 mg/dL

## 2019-04-08 LAB — VARICELLA ZOSTER ANTIBODY, IGG: Varicella IgG: 2370 index

## 2019-05-18 ENCOUNTER — Telehealth: Payer: Self-pay | Admitting: Gastroenterology

## 2019-05-18 NOTE — Telephone Encounter (Signed)
Pt taking the medication that Dr. Bryan Lemma prescribed but it has not helped. She has appt 11/6 to discuss an endoscopy but wants to know if there is anything she can do for some relief- she constantly feels like something is stuck in her throat and a burning and irritation feeling.

## 2019-05-19 NOTE — Telephone Encounter (Signed)
Thank you for the update and expediting her appt with me.

## 2019-05-19 NOTE — Telephone Encounter (Signed)
Called and spoke with patient-patient reports she is having a little difficulty with swallowing due to the increase mucus in throat-per previous message patient had requested alternative measures for this issue-patient now requesting to move appt sooner-patient has been rescheduled for 05/24/2019 at 1:20 per patient request-  Patient also reports she can wait for instructions until she is seen by the MD - patient requests that information be shared with MD but does not need a phone call with instructions at this time;

## 2019-05-24 ENCOUNTER — Encounter: Payer: Self-pay | Admitting: Gastroenterology

## 2019-05-24 ENCOUNTER — Ambulatory Visit: Payer: BC Managed Care – PPO | Admitting: Gastroenterology

## 2019-05-24 ENCOUNTER — Other Ambulatory Visit: Payer: Self-pay

## 2019-05-24 VITALS — BP 116/84 | HR 85 | Temp 98.3°F | Ht 67.0 in | Wt 159.2 lb

## 2019-05-24 DIAGNOSIS — R0989 Other specified symptoms and signs involving the circulatory and respiratory systems: Secondary | ICD-10-CM | POA: Diagnosis not present

## 2019-05-24 DIAGNOSIS — R198 Other specified symptoms and signs involving the digestive system and abdomen: Secondary | ICD-10-CM

## 2019-05-24 DIAGNOSIS — R12 Heartburn: Secondary | ICD-10-CM

## 2019-05-24 NOTE — Progress Notes (Signed)
P  Chief Complaint:    Globus sensation, postnasal drip, heartburn  GI History: 58 year old female referred for evaluation of heartburn, globus sensation since 09/2018.  No appreciable improvement with allergy medications.  Does have heartburn, but no dysphagia or odynophagia.  Heartburn symptoms worse at night and exacerbated by spicy foods and tomato-based foods.  Has been evaluated by Dr. Lorelei Pont [facility] Primary care), ENT at [facility] and Cardiology  Endoscopic history: -Colonoscopy (2015): Normal. Repeat in 10 years   HPI:     Patient is a 58 y.o. female presenting to the Gastroenterology Clinic for follow-up.  Was last seen by me in 02/2019, with plan to increase PPI to Protonix 40 mg bid x4 weeks for diagnostic/therapeutic intent, and if no improvement, plan for EGD.  Today, she states she feels like there is a "clump of phlegm "in the back of her throat, with increased throat clearing. No appreciable improvement with trial of high sode PPI. Still feels like a "pocket of mucus" on right/anterior neck. Can have nocturnal "throat burning". Has since decreased for Protonix 40 mg/day. No change with trials of dietary mods.  Heartburn symptoms otherwise well controlled with PPI.  Still no dysphagia or odynophagia.  Was seen in follow-up by Dr. Lorelei Pont [facility]) for postnasal drainage in 03/2019.  Was started on Atrovent nasal spray- some improvement.  Has also been seen by ENT.  Evaluated by Cardiology for atypical chest pain-noncardiac.   Review of systems:     No chest pain, no SOB, no fevers, no urinary sx   Past Medical History:  Diagnosis Date  . GERD (gastroesophageal reflux disease)    occ  . Papilloma of breast   . UTI (urinary tract infection)     Patient's surgical history, family medical history, social history, medications and allergies were all reviewed in Epic    Current Outpatient Medications  Medication Sig Dispense Refill  . Cholecalciferol (VITAMIN D-3)  1000 units CAPS Take 1 capsule by mouth daily.    Marland Kitchen ibuprofen (ADVIL,MOTRIN) 200 MG tablet Take 600 mg by mouth every 6 (six) hours as needed for headache or moderate pain.    Marland Kitchen ipratropium (ATROVENT) 0.03 % nasal spray Place 2 sprays into the nose 3 (three) times daily. Use as needed for post nasal drip 30 mL 6  . loratadine (CLARITIN) 10 MG tablet Take 1 tablet (10 mg total) by mouth daily. 30 tablet 11  . meloxicam (MOBIC) 7.5 MG tablet Take 1 tablet (7.5 mg total) by mouth daily. Use as needed for joint pain 30 tablet 6  . pantoprazole (PROTONIX) 40 MG tablet Take 1 tablet (40 mg total) by mouth daily. 90 tablet 3  . valACYclovir (VALTREX) 500 MG tablet Take 1 tablet (500 mg total) by mouth 2 (two) times daily as needed. For outbreaks 30 tablet 2  . fluticasone (FLONASE) 50 MCG/ACT nasal spray Place 2 sprays into both nostrils at bedtime.     No current facility-administered medications for this visit.     Physical Exam:     BP 116/84   Pulse 85   Temp 98.3 F (36.8 C)   Ht 5\' 7"  (1.702 m)   Wt 159 lb 4 oz (72.2 kg)   BMI 24.94 kg/m   GENERAL:  Pleasant female in NAD PSYCH: : Cooperative, normal affect EENT:  conjunctiva pink, mucous membranes moist, neck supple without masses CARDIAC:  RRR, no murmur heard, no peripheral edema PULM: Normal respiratory effort, lungs CTA bilaterally, no wheezing ABDOMEN:  Nondistended, soft, nontender. No obvious masses, no hepatomegaly,  normal bowel sounds SKIN:  turgor, no lesions seen Musculoskeletal:  Normal muscle tone, normal strength NEURO: Alert and oriented x 3, no focal neurologic deficits   IMPRESSION and PLAN:    1) Globus sensation 2) Heartburn While her typical reflux symptoms (heartburn) are responsive to PPI therapy, she continues to have globus sensation.  Unsure if this is a true GI manifestation.  Discussed DDX, to include atypical reflux, symptomatic gastric inlet patch, functional syndrome, hypersensitivity, etc. and  will proceed as below:  -EGD to evaluate for inlet patch, erosive esophagitis, LES laxity, hiatal hernia, or other mucosal/luminal etiology for symptoms -If EGD unrevealing, and symptoms ongoing, plan for pH/impedance testing (off PPI) therapy vs cross sectional imaging (CT neck) -Resume Protonix 40 mg/day -Resume antireflux lifestyle/dietary mods     The indications, risks, and benefits of EGD were explained to the patient in detail. Risks include but are not limited to bleeding, perforation, adverse reaction to medications, and cardiopulmonary compromise. Sequelae include but are not limited to the possibility of surgery, hositalization, and mortality. The patient verbalized understanding and wished to proceed. All questions answered, referred to scheduler. Further recommendations pending results of the exam.        Dominic Pea Raksha Wolfgang ,DO, FACG 05/24/2019, 1:25 PM

## 2019-05-24 NOTE — Patient Instructions (Signed)
If you are age 56 or older, your body mass index should be between 23-30. Your Body mass index is 24.94 kg/m. If this is out of the aforementioned range listed, please consider follow up with your Primary Care Provider.  If you are age 21 or younger, your body mass index should be between 19-25. Your Body mass index is 24.94 kg/m. If this is out of the aformentioned range listed, please consider follow up with your Primary Care Provider.   To help prevent the possible spread of infection to our patients, communities, and staff; we will be implementing the following measures:  As of now we are not allowing any visitors/family members to accompany you to any upcoming appointments with [facility]. If you have any concerns about this please contact our office to discuss prior to the appointment.   It has been recommended to you by your physician that you have a(n) EGD completed. Per your request, we did not schedule the procedure(s) today. Please contact our office at [phone] should you decide to have the procedure completed. You will be scheduled for a pre-visit and procedure at that time.  It was a pleasure to see you today!  Vito Cirigliano, D.O.

## 2019-06-03 ENCOUNTER — Encounter

## 2019-06-03 ENCOUNTER — Ambulatory Visit: Payer: BC Managed Care – PPO | Admitting: Gastroenterology

## 2019-06-08 ENCOUNTER — Ambulatory Visit (AMBULATORY_SURGERY_CENTER): Payer: BC Managed Care – PPO | Admitting: *Deleted

## 2019-06-08 ENCOUNTER — Other Ambulatory Visit: Payer: Self-pay

## 2019-06-08 VITALS — Temp 96.8°F | Ht 67.0 in | Wt 157.0 lb

## 2019-06-08 DIAGNOSIS — R0989 Other specified symptoms and signs involving the circulatory and respiratory systems: Secondary | ICD-10-CM

## 2019-06-08 DIAGNOSIS — R198 Other specified symptoms and signs involving the digestive system and abdomen: Secondary | ICD-10-CM

## 2019-06-08 DIAGNOSIS — R12 Heartburn: Secondary | ICD-10-CM

## 2019-06-08 DIAGNOSIS — Z1159 Encounter for screening for other viral diseases: Secondary | ICD-10-CM

## 2019-06-08 NOTE — Progress Notes (Signed)
Patient is here in-person for PV. Patient denies any allergies to eggs or soy. Patient denies any problems with anesthesia/sedation. Patient denies any oxygen use at home. Patient denies taking any diet/weight loss medications or blood thinners. Patient is not being treated for MRSA or C-diff. EMMI education assisgned to the patient for the procedure, this was explained and instructions given to patient. COVID-19 screening test is on 11/17 Tuesday at 140pm, the pt is aware. Pt is aware that care partner will wait in the car during procedure; if they feel like they will be too hot or cold to wait in the car; they may wait in the 4 th floor lobby. Patient is aware to bring only one care partner. We want them to wear a mask (we do not have any that we can provide them), practice social distancing, and we will check their temperatures when they get here.  I did remind the patient that their care partner needs to stay in the parking lot the entire time and have a cell phone available, we will call them when the pt is ready for discharge. Patient will wear mask into building.

## 2019-06-09 ENCOUNTER — Encounter: Payer: Self-pay | Admitting: Gastroenterology

## 2019-06-14 ENCOUNTER — Ambulatory Visit (INDEPENDENT_AMBULATORY_CARE_PROVIDER_SITE_OTHER): Payer: BC Managed Care – PPO

## 2019-06-14 DIAGNOSIS — Z03818 Encounter for observation for suspected exposure to other biological agents ruled out: Secondary | ICD-10-CM | POA: Diagnosis not present

## 2019-06-14 DIAGNOSIS — Z1159 Encounter for screening for other viral diseases: Secondary | ICD-10-CM

## 2019-06-15 LAB — SARS CORONAVIRUS 2 (TAT 6-24 HRS): SARS Coronavirus 2: NEGATIVE

## 2019-06-17 ENCOUNTER — Other Ambulatory Visit: Payer: Self-pay

## 2019-06-17 ENCOUNTER — Encounter: Payer: Self-pay | Admitting: Gastroenterology

## 2019-06-17 ENCOUNTER — Ambulatory Visit (AMBULATORY_SURGERY_CENTER): Payer: BC Managed Care – PPO | Admitting: Gastroenterology

## 2019-06-17 VITALS — BP 132/80 | HR 84 | Temp 98.4°F | Resp 16 | Ht 67.0 in | Wt 157.0 lb

## 2019-06-17 DIAGNOSIS — K2980 Duodenitis without bleeding: Secondary | ICD-10-CM

## 2019-06-17 DIAGNOSIS — K317 Polyp of stomach and duodenum: Secondary | ICD-10-CM | POA: Diagnosis not present

## 2019-06-17 DIAGNOSIS — K222 Esophageal obstruction: Secondary | ICD-10-CM

## 2019-06-17 DIAGNOSIS — K228 Other specified diseases of esophagus: Secondary | ICD-10-CM | POA: Diagnosis not present

## 2019-06-17 DIAGNOSIS — R198 Other specified symptoms and signs involving the digestive system and abdomen: Secondary | ICD-10-CM

## 2019-06-17 DIAGNOSIS — K2950 Unspecified chronic gastritis without bleeding: Secondary | ICD-10-CM

## 2019-06-17 DIAGNOSIS — K3189 Other diseases of stomach and duodenum: Secondary | ICD-10-CM

## 2019-06-17 DIAGNOSIS — R0989 Other specified symptoms and signs involving the circulatory and respiratory systems: Secondary | ICD-10-CM

## 2019-06-17 DIAGNOSIS — R12 Heartburn: Secondary | ICD-10-CM

## 2019-06-17 DIAGNOSIS — K2970 Gastritis, unspecified, without bleeding: Secondary | ICD-10-CM

## 2019-06-17 MED ORDER — OMEPRAZOLE 20 MG PO CPDR: 20.0000 mg | DELAYED_RELEASE_CAPSULE | Freq: Two times a day (BID) | ORAL | 0 refills | Status: AC

## 2019-06-17 MED ORDER — SODIUM CHLORIDE 0.9 % IV SOLN: 500.0000 mL | Freq: Once | INTRAVENOUS | Status: DC

## 2019-06-17 NOTE — Progress Notes (Signed)
A and O x3. Report to RN. Tolerated MAC anesthesia well.Teeth unchanged after procedure.

## 2019-06-17 NOTE — Patient Instructions (Signed)
Thank you for allowing [place] to care for you today!  Await pathology results, approximately 1-2 weeks.  Resume previous medications as before.  Start taking Prilosec  ( Omeprazole) 20 mg by mouth twice a day to promote mucosal healing.    Scheduling an appointment for Esophageal Manometry and pH/Impedance monitoring.  Must be off the Prilosec 5 days before  performing these tests.  Ordering CT of Neck to evaluate for extraluminal cause for symptoms.  Will call with appointment details.  Return to your normal activities tomorrow.    YOU HAD AN ENDOSCOPIC PROCEDURE TODAY AT [facility]:   Refer to the procedure report that was given to you for any specific questions about what was found during the examination.  If the procedure report does not answer your questions, please call your gastroenterologist to clarify.  If you requested that your care partner not be given the details of your procedure findings, then the procedure report has been included in a sealed envelope for you to review at your convenience later.  YOU SHOULD EXPECT: Some feelings of bloating in the abdomen. Passage of more gas than usual.  Walking can help get rid of the air that was put into your GI tract during the procedure and reduce the bloating. If you had a lower endoscopy (such as a colonoscopy or flexible sigmoidoscopy) you may notice spotting of blood in your stool or on the toilet paper. If you underwent a bowel prep for your procedure, you may not have a normal bowel movement for a few days.  Please Note:  You might notice some irritation and congestion in your nose or some drainage.  This is from the oxygen used during your procedure.  There is no need for concern and it should clear up in a day or so.  SYMPTOMS TO REPORT IMMEDIATELY:     Following upper endoscopy (EGD)  Vomiting of blood or coffee ground material  New chest pain or pain under the shoulder blades  Painful or persistently  difficult swallowing  New shortness of breath  Fever of 100F or higher  Black, tarry-looking stools  For urgent or emergent issues, a gastroenterologist can be reached at any hour by calling [phone].   DIET:  We do recommend a small meal at first, but then you may proceed to your regular diet.  Drink plenty of fluids but you should avoid alcoholic beverages for 24 hours.  ACTIVITY:  You should plan to take it easy for the rest of today and you should NOT DRIVE or use heavy machinery until tomorrow (because of the sedation medicines used during the test).    FOLLOW UP: Our staff will call the number listed on your records 48-72 hours following your procedure to check on you and address any questions or concerns that you may have regarding the information given to you following your procedure. If we do not reach you, we will leave a message.  We will attempt to reach you two times.  During this call, we will ask if you have developed any symptoms of COVID 19. If you develop any symptoms (ie: fever, flu-like symptoms, shortness of breath, cough etc.) before then, please call [phone].  If you test positive for Covid 19 in the 2 weeks post procedure, please call and report this information to [place].    If any biopsies were taken you will be contacted by phone or by letter within the next 1-3 weeks.  Please call us at [phone] if you have not heard about the biopsies in 3 weeks.    SIGNATURES/CONFIDENTIALITY: You and/or your care partner have signed paperwork which will be entered into your electronic medical record.  These signatures attest to the fact that that the information above on your After Visit Summary has been reviewed and is understood.  Full responsibility of the confidentiality of this discharge information lies with you and/or your care-partner.

## 2019-06-17 NOTE — Progress Notes (Signed)
Pt's states no medical or surgical changes since previsit or office visit.  CW vitals, KWeiss IV and JB temps.

## 2019-06-17 NOTE — Op Note (Signed)
[facility] Patient Name: Sheryl Bryan Procedure Date: 06/17/2019 9:42 AM MRN: [id number] Endoscopist: Gerrit Heck , MD Age: 58 Referring MD:  Date of Birth: 1960/11/15 Gender: Female Account #: [id number] Procedure:                Upper GI endoscopy Indications:              Heartburn, Globus sensation                           58 yo female with heartburn, globus sensation,                            increased throat clearing. Sensation of                            fullness/irritation in right neck. No change with                            trial of high dose PPI. Medicines:                Monitored Anesthesia Care Procedure:                Pre-Anesthesia Assessment:                           - Prior to the procedure, a History and Physical                            was performed, and patient medications and                            allergies were reviewed. The patient's tolerance of                            previous anesthesia was also reviewed. The risks                            and benefits of the procedure and the sedation                            options and risks were discussed with the patient.                            All questions were answered, and informed consent                            was obtained. Prior Anticoagulants: The patient has                            taken no previous anticoagulant or antiplatelet                            agents. ASA Grade Assessment: II - A patient with  mild systemic disease. After reviewing the risks                            and benefits, the patient was deemed in                            satisfactory condition to undergo the procedure.                           After obtaining informed consent, the endoscope was                            passed under direct vision. Throughout the                            procedure, the patient's blood pressure, pulse, and              oxygen saturations were monitored continuously. The                            Endoscope was introduced through the mouth, and                            advanced to the second part of duodenum. The upper                            GI endoscopy was accomplished without difficulty.                            The patient tolerated the procedure well. Scope In: Scope Out: Findings:                 The upper third of the esophagus and middle third                            of the esophagus were normal. No gastric inlet                            patch, luminal narrowing, ring, stricture, or                            mucosal erythema/edema.                           Mildly severe esophagitis with no bleeding was                            found 40 cm from the incisors, at the GEJ. Biopsies                            were taken with a cold forceps for histology.                            Estimated blood loss was minimal.  One benign-appearing, intrinsic mild stenosis was                            found 40 cm from the incisors. This stenosis                            measured less than one cm (in length). The stenosis                            was non-obstructing and easily traversed. This was                            biopsied with a cold forceps for fracturing of the                            ring and histology. Estimated blood loss was                            minimal.                           The gastroesophageal flap valve was visualized                            endoscopically and classified as Hill Grade I                            (prominent fold, tight to endoscope).                           Mild inflammation characterized by erosions and                            erythema was found in the gastric antrum. Biopsies                            were taken with a cold forceps for Helicobacter                            pylori testing.  Estimated blood loss was minimal.                           A few small sessile polyps with no stigmata of                            recent bleeding were found in the gastric fundus                            and in the gastric body. Several of these polyps                            were removed with a cold biopsy forceps for  histologic representative evaluation. Resection and                            retrieval were complete. Estimated blood loss was                            minimal.                           Localized mildly erythematous mucosa without active                            bleeding and with no stigmata of bleeding was found                            in the duodenal bulb. Biopsies were taken with a                            cold forceps for histology. Estimated blood loss                            was minimal.                           The second portion of the duodenum was normal. Complications:            No immediate complications. Estimated Blood Loss:     Estimated blood loss was minimal. Impression:               - Normal upper third of esophagus and middle third                            of esophagus.                           - Mildly severe reflux esophagitis with no                            bleeding. Biopsied.                           - Benign-appearing esophageal stenosis. Location                            and appearance seems most consistent with peptic                            stricture. Biopsied for histology as well as                            fracturing of the ring.                           - Gastroesophageal flap valve classified as Hill  Grade I (prominent fold, tight to endoscope).                           - Gastritis. Biopsied.                           - A few gastric polyps. Resected and retrieved.                           - Erythematous duodenopathy. Biopsied.                            - Normal second portion of the duodenum. Recommendation:           - Patient has a contact number available for                            emergencies. The signs and symptoms of potential                            delayed complications were discussed with the                            patient. Return to normal activities tomorrow.                            Written discharge instructions were provided to the                            patient.                           - Resume previous diet.                           - Continue present medications.                           - Await pathology results.                           - Use Prilosec (omeprazole) 20 mg PO BID for 6                            weeks to promote mucosal healing.                           - Perform ambulatory Esophageal Manometry and                            pH/Impedance monitoring (off PPI therapy x5 days)                            at appointment to be scheduled.                           - Recommend CT  Neck to evaluate for extraluminal                            etiology for symptoms. Gerrit Heck, MD 06/17/2019 10:23:25 AM

## 2019-06-17 NOTE — Progress Notes (Signed)
Called to room to assist during endoscopic procedure.  Patient ID and intended procedure confirmed with present staff. Received instructions for my participation in the procedure from the performing physician.  

## 2019-06-21 ENCOUNTER — Telehealth: Payer: Self-pay | Admitting: *Deleted

## 2019-06-21 ENCOUNTER — Telehealth: Payer: Self-pay | Admitting: Gastroenterology

## 2019-06-21 ENCOUNTER — Encounter: Payer: Self-pay | Admitting: Gastroenterology

## 2019-06-21 DIAGNOSIS — R12 Heartburn: Secondary | ICD-10-CM

## 2019-06-21 DIAGNOSIS — K222 Esophageal obstruction: Secondary | ICD-10-CM

## 2019-06-21 DIAGNOSIS — R198 Other specified symptoms and signs involving the digestive system and abdomen: Secondary | ICD-10-CM

## 2019-06-21 DIAGNOSIS — R0989 Other specified symptoms and signs involving the circulatory and respiratory systems: Secondary | ICD-10-CM

## 2019-06-21 DIAGNOSIS — K2970 Gastritis, unspecified, without bleeding: Secondary | ICD-10-CM

## 2019-06-21 NOTE — Telephone Encounter (Signed)
Called and spoke with patient-patient reports she does not want to proceed with MD recommended eso mano at this time; patient reports she wants to proceed with CT scan ; Patient has been scheduled for her CT neck at [facility] on 07/01/2019 at 3:00 pm; patient is to arrive at 2:45pm; NPO after 11:00am;  Patient reports she will call back to the office when she is ready to schedule the eso mano with 24 hr pH imp;   Patient is requesting to know if Prilosec RX needs to be changed to alternative medication due to insurance will only cover once daily dosage OR if she needs to purchase OTC Prilosec and take it with the RX Prilosec? Please advise

## 2019-06-21 NOTE — Telephone Encounter (Signed)
No answer for post procedure call back. Left message for patient to call back with questions or concerns. SM 

## 2019-06-21 NOTE — Telephone Encounter (Signed)
No answer, message left for the patient. 

## 2019-06-21 NOTE — Telephone Encounter (Addendum)
Per the 06/17/2019 procedure notation-patient is to be scheduled for a CT neck-is this to be with, without, or with/without contrast? And is it to be soft tissue CT? Please advise  And is the eso mano with 24 hrs pH imp?

## 2019-06-21 NOTE — Telephone Encounter (Signed)
Yes CT neck soft tissue with contrast.  Evaluating for extraluminal pathology.  Esophageal manometry and 24-hour pH/impedance to be done off PPI therapy.

## 2019-06-22 MED ORDER — PANTOPRAZOLE SODIUM 40 MG PO TBEC: 40.0000 mg | DELAYED_RELEASE_TABLET | Freq: Two times a day (BID) | ORAL | 0 refills | Status: AC

## 2019-06-22 NOTE — Telephone Encounter (Signed)
Called and spoke with patient-patient advised of MD recommendations and is agreeable with plan of care; RX sent to pharmacy of patient choice; Patient advised to call back to the office at [phone] should questions/concerns arise;  Patient verbalized understanding of information/instructions;

## 2019-06-22 NOTE — Telephone Encounter (Signed)
If insurance will not cover, can change to Protonix 40 mg PO BID x6 weeks instead, which should be covered. Thanks.

## 2019-06-27 ENCOUNTER — Telehealth: Payer: Self-pay | Admitting: Gastroenterology

## 2019-06-27 NOTE — Telephone Encounter (Signed)
Pt is requesting to have imaging at a different place due to out-of-pocket cost. She is asking if she can go to [facility] on [address]. If possible, pls fax order to [phone]. They gave pt auth number for CT scan with contrast [id number].

## 2019-06-28 ENCOUNTER — Telehealth: Payer: Self-pay | Admitting: Gastroenterology

## 2019-06-28 NOTE — Telephone Encounter (Signed)
Spoke with Berline Chough @ [facility] authorization has been updated to new place of service as [facility] on [address] same authorization # [id number] valid 06/21/2019-07/20/2019.

## 2019-06-28 NOTE — Telephone Encounter (Signed)
Lesly Rubenstein, I will need the new facility's NPI # and the new date of service to update the authorization with [employer].   Thank you, Lorriane Shire

## 2019-06-28 NOTE — Telephone Encounter (Signed)
Sheryl Bryan Patient is scheduled for 07/01/2019 at 3:00pm for check in

## 2019-06-28 NOTE — Telephone Encounter (Signed)
Sheryl Bryan, can you help Bre with this? This is a case you worked on.  Thanks!

## 2019-06-28 NOTE — Telephone Encounter (Signed)
Kayla from [facility] called she needs you to fax over a Signed Order with Docs signature,name,test and Diagnosis Fax to [phone]  Order from previous documentation for [facility] request: CT soft tissue neck with contrast  Dx: globus sensation/Evaluating for extraluminal pathology/heartburn

## 2019-06-28 NOTE — Telephone Encounter (Signed)
Documentation has been faxed to [facility] per patient/office request;

## 2019-06-28 NOTE — Telephone Encounter (Signed)
Please advise on what the RN needs to do to make sure this scan is precerted to be done at [facility]

## 2019-07-01 ENCOUNTER — Ambulatory Visit (HOSPITAL_COMMUNITY): Payer: BC Managed Care – PPO

## 2019-07-05 ENCOUNTER — Other Ambulatory Visit: Payer: Self-pay | Admitting: Internal Medicine

## 2019-07-05 DIAGNOSIS — R42 Dizziness and giddiness: Secondary | ICD-10-CM

## 2019-07-06 DIAGNOSIS — R221 Localized swelling, mass and lump, neck: Secondary | ICD-10-CM | POA: Diagnosis not present

## 2019-07-06 DIAGNOSIS — F458 Other somatoform disorders: Secondary | ICD-10-CM | POA: Diagnosis not present

## 2019-07-13 ENCOUNTER — Ambulatory Visit (HOSPITAL_COMMUNITY): Admit: 2019-07-13 | Payer: BC Managed Care – PPO | Admitting: Gastroenterology

## 2019-07-13 ENCOUNTER — Encounter (HOSPITAL_COMMUNITY): Payer: Self-pay

## 2019-07-13 SURGERY — MANOMETRY, ESOPHAGUS
Anesthesia: Choice

## 2019-08-31 ENCOUNTER — Encounter: Payer: Self-pay | Admitting: Family Medicine

## 2019-08-31 DIAGNOSIS — Z6824 Body mass index (BMI) 24.0-24.9, adult: Secondary | ICD-10-CM | POA: Diagnosis not present

## 2019-08-31 DIAGNOSIS — N898 Other specified noninflammatory disorders of vagina: Secondary | ICD-10-CM | POA: Diagnosis not present

## 2019-08-31 DIAGNOSIS — Z01419 Encounter for gynecological examination (general) (routine) without abnormal findings: Secondary | ICD-10-CM | POA: Diagnosis not present

## 2019-08-31 DIAGNOSIS — R350 Frequency of micturition: Secondary | ICD-10-CM | POA: Diagnosis not present

## 2019-08-31 DIAGNOSIS — Z1151 Encounter for screening for human papillomavirus (HPV): Secondary | ICD-10-CM | POA: Diagnosis not present

## 2019-08-31 DIAGNOSIS — Z1231 Encounter for screening mammogram for malignant neoplasm of breast: Secondary | ICD-10-CM | POA: Diagnosis not present

## 2019-09-02 ENCOUNTER — Telehealth: Payer: Self-pay | Admitting: Family Medicine

## 2019-09-02 NOTE — Telephone Encounter (Signed)
Recv'd records from [facility]  forwarded 1 page to Dr. Janett Billow Copland 2/5/21fbg

## 2020-01-16 IMAGING — DX DG HIP (WITH OR WITHOUT PELVIS) 2-3V*R*
3 series · 3 of 3 positions shown · non-contrast
Comparison: None.

CLINICAL DATA: Right hip pain for 6 months.  Low back pain.

EXAM:
DG HIP (WITH OR WITHOUT PELVIS) 2-3V RIGHT

[pelvis ap]
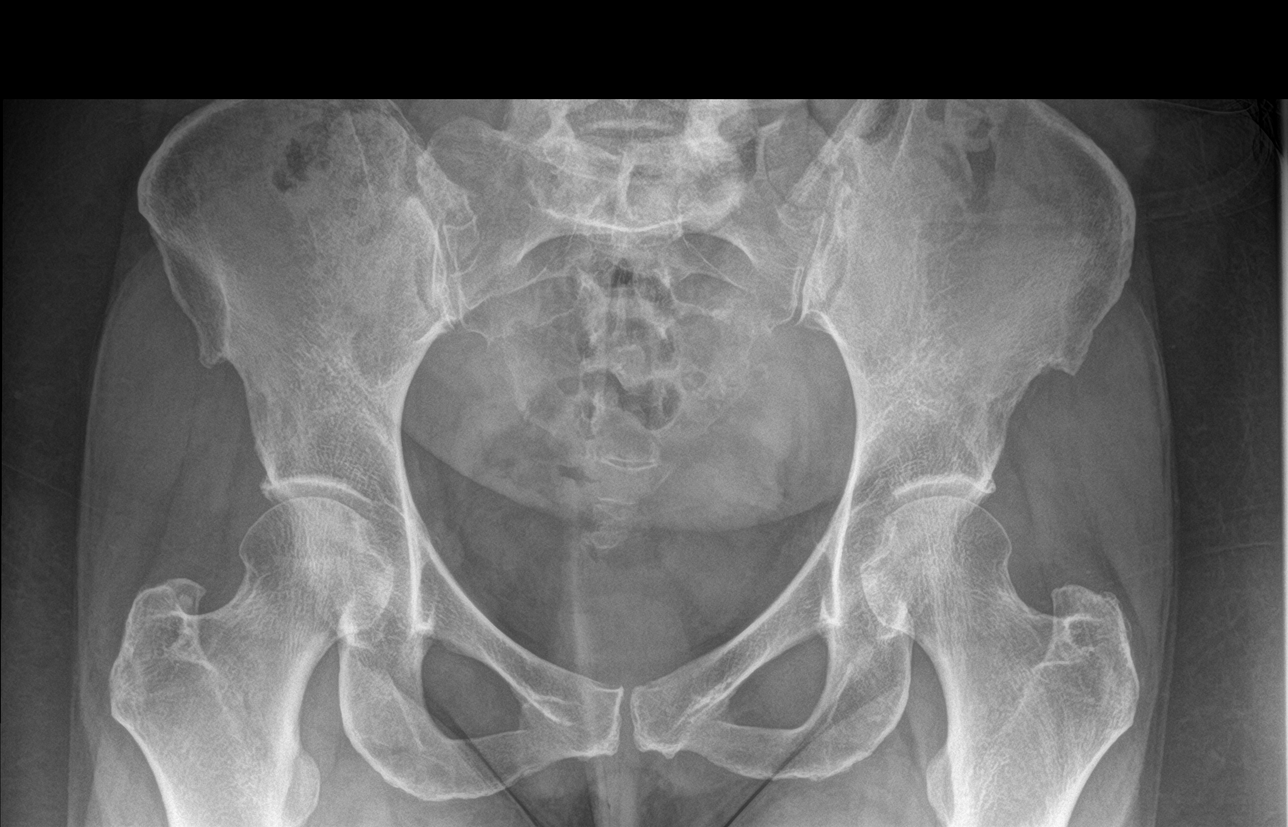

[hip ap]
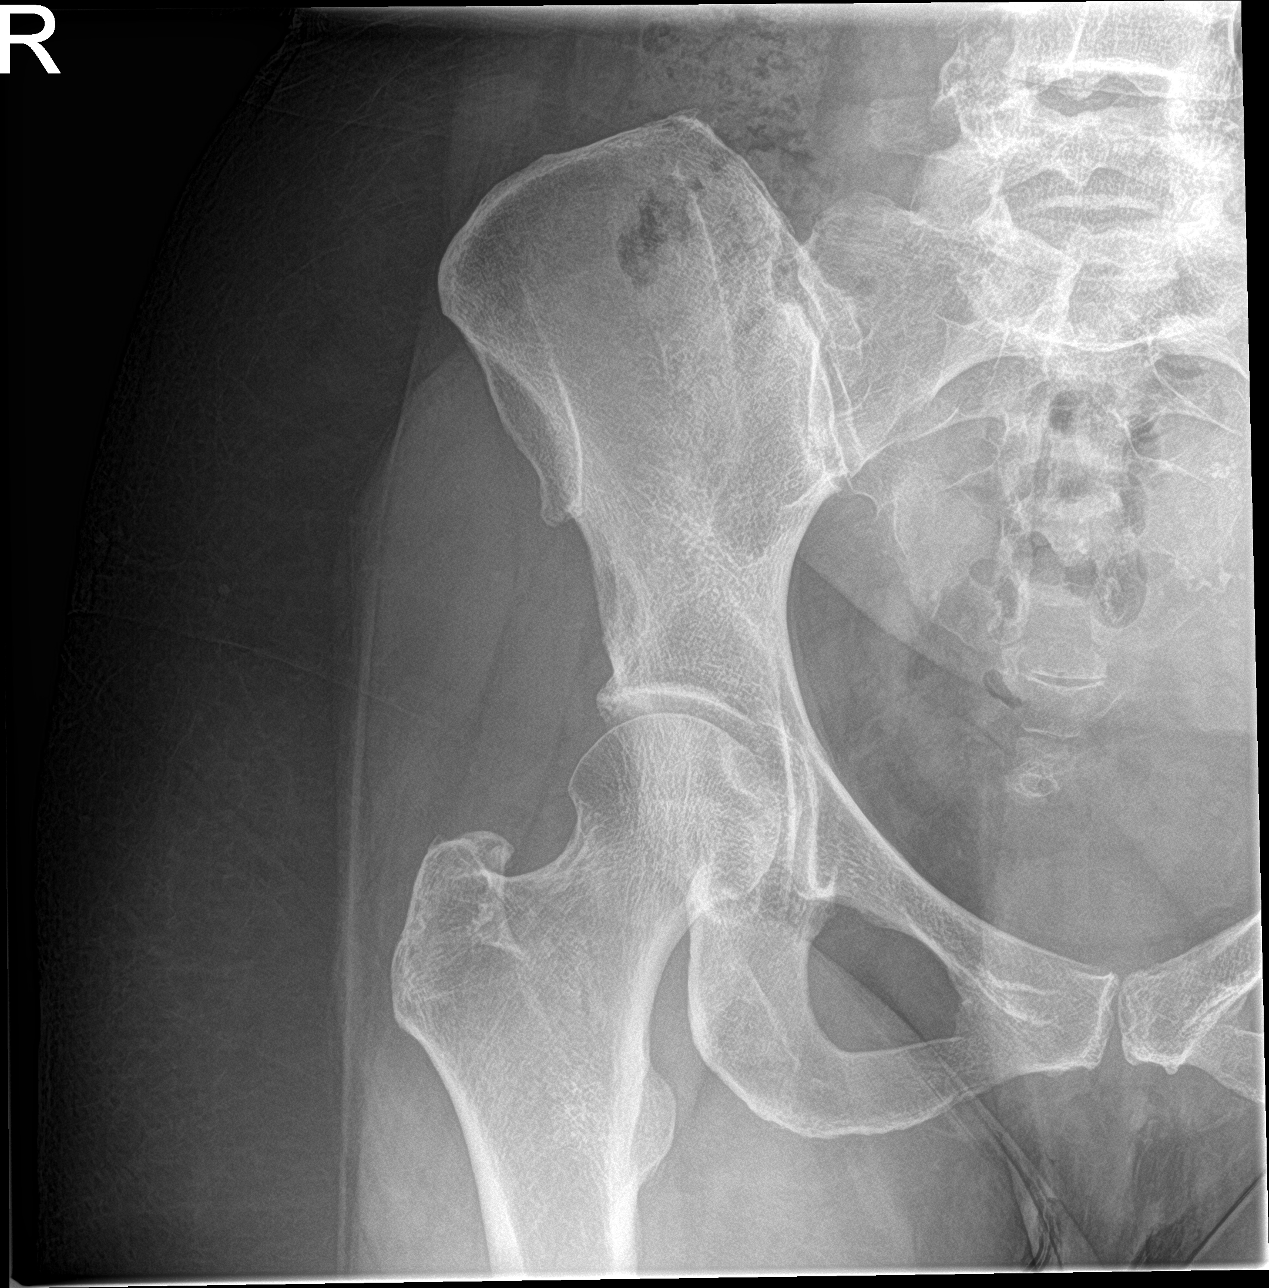

[hip lat]
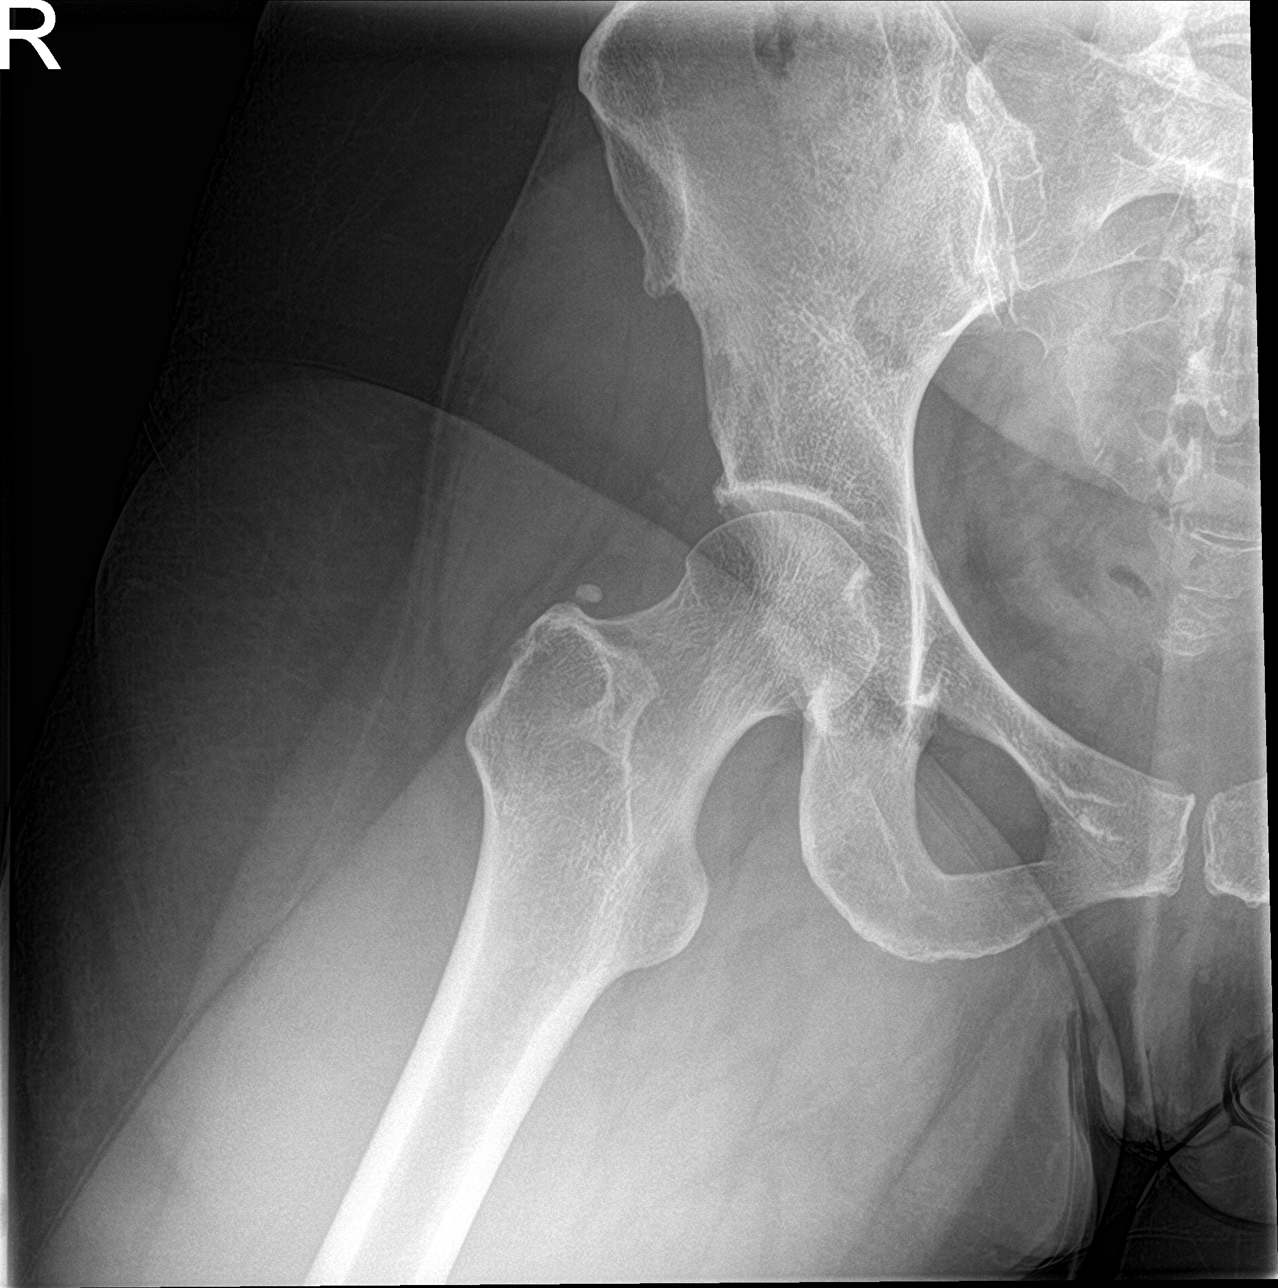

[3 of 3 positions shown; findings below may reference images not displayed]

FINDINGS: Preserved articular spaces in the hips. No significant arthropathy.
No appreciable bony abnormality.
IMPRESSION: Negative.

## 2020-02-08 ENCOUNTER — Encounter: Payer: Self-pay | Admitting: Family Medicine

## 2020-02-08 DIAGNOSIS — B009 Herpesviral infection, unspecified: Secondary | ICD-10-CM

## 2020-02-08 MED ORDER — VALACYCLOVIR HCL 500 MG PO TABS: 500.0000 mg | ORAL_TABLET | Freq: Two times a day (BID) | ORAL | 5 refills | Status: DC | PRN

## 2021-06-07 ENCOUNTER — Other Ambulatory Visit: Payer: Self-pay | Admitting: Family Medicine

## 2021-06-07 DIAGNOSIS — B009 Herpesviral infection, unspecified: Secondary | ICD-10-CM
